# Patient Record
Sex: Female | Born: 1950
Health system: Southern US, Community
[De-identification: ages and names within clinical notes are randomized; demographics above are authoritative.]

## PROBLEM LIST (undated history)

## (undated) DIAGNOSIS — J302 Other seasonal allergic rhinitis: Secondary | ICD-10-CM

## (undated) DIAGNOSIS — Z9889 Other specified postprocedural states: Secondary | ICD-10-CM

## (undated) DIAGNOSIS — R519 Headache, unspecified: Secondary | ICD-10-CM

## (undated) DIAGNOSIS — I8393 Asymptomatic varicose veins of bilateral lower extremities: Secondary | ICD-10-CM

## (undated) DIAGNOSIS — M1990 Unspecified osteoarthritis, unspecified site: Secondary | ICD-10-CM

## (undated) DIAGNOSIS — F419 Anxiety disorder, unspecified: Secondary | ICD-10-CM

## (undated) DIAGNOSIS — E119 Type 2 diabetes mellitus without complications: Secondary | ICD-10-CM

## (undated) HISTORY — DX: Unspecified osteoarthritis, unspecified site: M19.90

## (undated) HISTORY — PX: NECK SURGERY: SHX720

## (undated) HISTORY — PX: BACK SURGERY: SHX140

## (undated) HISTORY — PX: BRAIN SURGERY: SHX531

## (undated) HISTORY — PX: COLON SURGERY: SHX602

## (undated) HISTORY — PX: HYSTERECTOMY ABDOMINAL WITH SALPINGO-OOPHORECTOMY: SHX6792

## (undated) HISTORY — PX: CHOLECYSTECTOMY: SHX55

## (undated) HISTORY — PX: KNEE ARTHROPLASTY: SHX992

## (undated) HISTORY — DX: Anxiety disorder, unspecified: F41.9

## (undated) HISTORY — DX: Type 2 diabetes mellitus without complications: E11.9

## (undated) HISTORY — DX: Other seasonal allergic rhinitis: J30.2

## (undated) HISTORY — PX: WISDOM TOOTH EXTRACTION: SHX21

---

## 2005-09-16 ENCOUNTER — Ambulatory Visit (HOSPITAL_COMMUNITY): Admission: RE | Admit: 2005-09-16 | Discharge: 2005-09-17 | Payer: Self-pay | Admitting: Neurosurgery

## 2016-01-12 DIAGNOSIS — J0100 Acute maxillary sinusitis, unspecified: Secondary | ICD-10-CM | POA: Diagnosis not present

## 2016-01-12 DIAGNOSIS — E1165 Type 2 diabetes mellitus with hyperglycemia: Secondary | ICD-10-CM | POA: Diagnosis not present

## 2016-01-12 DIAGNOSIS — H81399 Other peripheral vertigo, unspecified ear: Secondary | ICD-10-CM | POA: Diagnosis not present

## 2016-05-26 DIAGNOSIS — Z1389 Encounter for screening for other disorder: Secondary | ICD-10-CM | POA: Diagnosis not present

## 2016-05-26 DIAGNOSIS — E1122 Type 2 diabetes mellitus with diabetic chronic kidney disease: Secondary | ICD-10-CM | POA: Diagnosis not present

## 2016-05-26 DIAGNOSIS — J302 Other seasonal allergic rhinitis: Secondary | ICD-10-CM | POA: Diagnosis not present

## 2016-05-26 DIAGNOSIS — E1165 Type 2 diabetes mellitus with hyperglycemia: Secondary | ICD-10-CM | POA: Diagnosis not present

## 2016-05-26 DIAGNOSIS — G2581 Restless legs syndrome: Secondary | ICD-10-CM | POA: Diagnosis not present

## 2016-05-26 DIAGNOSIS — Z23 Encounter for immunization: Secondary | ICD-10-CM | POA: Diagnosis not present

## 2016-05-26 DIAGNOSIS — E782 Mixed hyperlipidemia: Secondary | ICD-10-CM | POA: Diagnosis not present

## 2016-05-26 DIAGNOSIS — F329 Major depressive disorder, single episode, unspecified: Secondary | ICD-10-CM | POA: Diagnosis not present

## 2016-06-10 DIAGNOSIS — Z1231 Encounter for screening mammogram for malignant neoplasm of breast: Secondary | ICD-10-CM | POA: Diagnosis not present

## 2016-09-03 DIAGNOSIS — Z23 Encounter for immunization: Secondary | ICD-10-CM | POA: Diagnosis not present

## 2016-12-07 DIAGNOSIS — G2581 Restless legs syndrome: Secondary | ICD-10-CM | POA: Diagnosis not present

## 2016-12-07 DIAGNOSIS — E1122 Type 2 diabetes mellitus with diabetic chronic kidney disease: Secondary | ICD-10-CM | POA: Diagnosis not present

## 2016-12-07 DIAGNOSIS — B001 Herpesviral vesicular dermatitis: Secondary | ICD-10-CM | POA: Diagnosis not present

## 2016-12-07 DIAGNOSIS — F329 Major depressive disorder, single episode, unspecified: Secondary | ICD-10-CM | POA: Diagnosis not present

## 2016-12-07 DIAGNOSIS — E1165 Type 2 diabetes mellitus with hyperglycemia: Secondary | ICD-10-CM | POA: Diagnosis not present

## 2016-12-07 DIAGNOSIS — E782 Mixed hyperlipidemia: Secondary | ICD-10-CM | POA: Diagnosis not present

## 2016-12-07 DIAGNOSIS — R42 Dizziness and giddiness: Secondary | ICD-10-CM | POA: Diagnosis not present

## 2017-01-29 DIAGNOSIS — J310 Chronic rhinitis: Secondary | ICD-10-CM | POA: Diagnosis not present

## 2017-06-03 DIAGNOSIS — E119 Type 2 diabetes mellitus without complications: Secondary | ICD-10-CM | POA: Diagnosis not present

## 2017-06-03 DIAGNOSIS — H2513 Age-related nuclear cataract, bilateral: Secondary | ICD-10-CM | POA: Diagnosis not present

## 2017-06-12 DIAGNOSIS — J209 Acute bronchitis, unspecified: Secondary | ICD-10-CM | POA: Diagnosis not present

## 2017-06-12 DIAGNOSIS — J069 Acute upper respiratory infection, unspecified: Secondary | ICD-10-CM | POA: Diagnosis not present

## 2017-06-13 DIAGNOSIS — J40 Bronchitis, not specified as acute or chronic: Secondary | ICD-10-CM | POA: Diagnosis not present

## 2017-06-13 DIAGNOSIS — M2550 Pain in unspecified joint: Secondary | ICD-10-CM | POA: Diagnosis not present

## 2017-06-14 DIAGNOSIS — Z1231 Encounter for screening mammogram for malignant neoplasm of breast: Secondary | ICD-10-CM | POA: Diagnosis not present

## 2017-06-22 DIAGNOSIS — R928 Other abnormal and inconclusive findings on diagnostic imaging of breast: Secondary | ICD-10-CM | POA: Diagnosis not present

## 2017-06-22 DIAGNOSIS — N6489 Other specified disorders of breast: Secondary | ICD-10-CM | POA: Diagnosis not present

## 2017-09-01 DIAGNOSIS — Z23 Encounter for immunization: Secondary | ICD-10-CM | POA: Diagnosis not present

## 2017-11-28 DIAGNOSIS — Z0000 Encounter for general adult medical examination without abnormal findings: Secondary | ICD-10-CM | POA: Diagnosis not present

## 2017-11-28 DIAGNOSIS — G2581 Restless legs syndrome: Secondary | ICD-10-CM | POA: Insufficient documentation

## 2017-11-28 DIAGNOSIS — I10 Essential (primary) hypertension: Secondary | ICD-10-CM | POA: Diagnosis not present

## 2017-11-28 DIAGNOSIS — E1165 Type 2 diabetes mellitus with hyperglycemia: Secondary | ICD-10-CM | POA: Insufficient documentation

## 2017-11-28 DIAGNOSIS — E782 Mixed hyperlipidemia: Secondary | ICD-10-CM | POA: Diagnosis not present

## 2017-11-28 DIAGNOSIS — J40 Bronchitis, not specified as acute or chronic: Secondary | ICD-10-CM | POA: Diagnosis not present

## 2018-06-08 DIAGNOSIS — G8929 Other chronic pain: Secondary | ICD-10-CM | POA: Diagnosis not present

## 2018-06-08 DIAGNOSIS — M545 Low back pain: Secondary | ICD-10-CM | POA: Diagnosis not present

## 2018-06-08 DIAGNOSIS — E1165 Type 2 diabetes mellitus with hyperglycemia: Secondary | ICD-10-CM | POA: Diagnosis not present

## 2018-06-08 DIAGNOSIS — M2550 Pain in unspecified joint: Secondary | ICD-10-CM | POA: Diagnosis not present

## 2018-06-08 DIAGNOSIS — E782 Mixed hyperlipidemia: Secondary | ICD-10-CM | POA: Diagnosis not present

## 2018-06-08 DIAGNOSIS — I10 Essential (primary) hypertension: Secondary | ICD-10-CM | POA: Diagnosis not present

## 2018-06-26 DIAGNOSIS — E119 Type 2 diabetes mellitus without complications: Secondary | ICD-10-CM | POA: Diagnosis not present

## 2018-06-26 DIAGNOSIS — H2513 Age-related nuclear cataract, bilateral: Secondary | ICD-10-CM | POA: Diagnosis not present

## 2018-06-27 DIAGNOSIS — Z1231 Encounter for screening mammogram for malignant neoplasm of breast: Secondary | ICD-10-CM | POA: Diagnosis not present

## 2018-07-29 DIAGNOSIS — J0100 Acute maxillary sinusitis, unspecified: Secondary | ICD-10-CM | POA: Diagnosis not present

## 2018-08-05 DIAGNOSIS — Z23 Encounter for immunization: Secondary | ICD-10-CM | POA: Diagnosis not present

## 2018-09-09 DIAGNOSIS — J019 Acute sinusitis, unspecified: Secondary | ICD-10-CM | POA: Diagnosis not present

## 2018-09-19 DIAGNOSIS — M545 Low back pain: Secondary | ICD-10-CM | POA: Diagnosis not present

## 2018-09-19 DIAGNOSIS — G8929 Other chronic pain: Secondary | ICD-10-CM | POA: Diagnosis not present

## 2018-09-19 DIAGNOSIS — R509 Fever, unspecified: Secondary | ICD-10-CM | POA: Diagnosis not present

## 2018-09-19 DIAGNOSIS — M2550 Pain in unspecified joint: Secondary | ICD-10-CM | POA: Diagnosis not present

## 2018-09-19 DIAGNOSIS — J40 Bronchitis, not specified as acute or chronic: Secondary | ICD-10-CM | POA: Diagnosis not present

## 2018-11-15 DIAGNOSIS — U071 COVID-19: Secondary | ICD-10-CM

## 2018-11-15 HISTORY — DX: COVID-19: U07.1

## 2019-01-01 DIAGNOSIS — B001 Herpesviral vesicular dermatitis: Secondary | ICD-10-CM | POA: Diagnosis not present

## 2019-01-01 DIAGNOSIS — E1165 Type 2 diabetes mellitus with hyperglycemia: Secondary | ICD-10-CM | POA: Diagnosis not present

## 2019-01-01 DIAGNOSIS — M2550 Pain in unspecified joint: Secondary | ICD-10-CM | POA: Diagnosis not present

## 2019-01-01 DIAGNOSIS — F329 Major depressive disorder, single episode, unspecified: Secondary | ICD-10-CM | POA: Diagnosis not present

## 2019-01-01 DIAGNOSIS — I10 Essential (primary) hypertension: Secondary | ICD-10-CM | POA: Diagnosis not present

## 2019-01-01 DIAGNOSIS — R42 Dizziness and giddiness: Secondary | ICD-10-CM | POA: Diagnosis not present

## 2019-01-01 DIAGNOSIS — F3342 Major depressive disorder, recurrent, in full remission: Secondary | ICD-10-CM | POA: Diagnosis not present

## 2019-01-01 DIAGNOSIS — Z0000 Encounter for general adult medical examination without abnormal findings: Secondary | ICD-10-CM | POA: Diagnosis not present

## 2019-01-01 DIAGNOSIS — Z1211 Encounter for screening for malignant neoplasm of colon: Secondary | ICD-10-CM | POA: Diagnosis not present

## 2019-01-01 DIAGNOSIS — E782 Mixed hyperlipidemia: Secondary | ICD-10-CM | POA: Diagnosis not present

## 2019-01-01 DIAGNOSIS — G2581 Restless legs syndrome: Secondary | ICD-10-CM | POA: Diagnosis not present

## 2019-01-05 DIAGNOSIS — Z1211 Encounter for screening for malignant neoplasm of colon: Secondary | ICD-10-CM | POA: Diagnosis not present

## 2019-02-14 DIAGNOSIS — D225 Melanocytic nevi of trunk: Secondary | ICD-10-CM | POA: Diagnosis not present

## 2019-02-14 DIAGNOSIS — L821 Other seborrheic keratosis: Secondary | ICD-10-CM | POA: Diagnosis not present

## 2019-02-14 DIAGNOSIS — D1801 Hemangioma of skin and subcutaneous tissue: Secondary | ICD-10-CM | POA: Diagnosis not present

## 2019-02-14 DIAGNOSIS — E7800 Pure hypercholesterolemia, unspecified: Secondary | ICD-10-CM | POA: Diagnosis not present

## 2019-02-14 DIAGNOSIS — L820 Inflamed seborrheic keratosis: Secondary | ICD-10-CM | POA: Diagnosis not present

## 2019-02-14 DIAGNOSIS — D485 Neoplasm of uncertain behavior of skin: Secondary | ICD-10-CM | POA: Diagnosis not present

## 2019-02-14 DIAGNOSIS — M48062 Spinal stenosis, lumbar region with neurogenic claudication: Secondary | ICD-10-CM | POA: Diagnosis not present

## 2019-06-12 DIAGNOSIS — L820 Inflamed seborrheic keratosis: Secondary | ICD-10-CM | POA: Diagnosis not present

## 2019-07-20 DIAGNOSIS — E119 Type 2 diabetes mellitus without complications: Secondary | ICD-10-CM | POA: Diagnosis not present

## 2019-07-20 DIAGNOSIS — H2513 Age-related nuclear cataract, bilateral: Secondary | ICD-10-CM | POA: Diagnosis not present

## 2019-10-04 DIAGNOSIS — Z1231 Encounter for screening mammogram for malignant neoplasm of breast: Secondary | ICD-10-CM | POA: Diagnosis not present

## 2020-01-23 DIAGNOSIS — E782 Mixed hyperlipidemia: Secondary | ICD-10-CM | POA: Diagnosis not present

## 2020-01-23 DIAGNOSIS — G2581 Restless legs syndrome: Secondary | ICD-10-CM | POA: Diagnosis not present

## 2020-01-23 DIAGNOSIS — B001 Herpesviral vesicular dermatitis: Secondary | ICD-10-CM | POA: Diagnosis not present

## 2020-01-23 DIAGNOSIS — E1165 Type 2 diabetes mellitus with hyperglycemia: Secondary | ICD-10-CM | POA: Diagnosis not present

## 2020-01-23 DIAGNOSIS — R42 Dizziness and giddiness: Secondary | ICD-10-CM | POA: Diagnosis not present

## 2020-01-23 DIAGNOSIS — I10 Essential (primary) hypertension: Secondary | ICD-10-CM | POA: Diagnosis not present

## 2020-01-23 DIAGNOSIS — F3342 Major depressive disorder, recurrent, in full remission: Secondary | ICD-10-CM | POA: Diagnosis not present

## 2020-01-23 DIAGNOSIS — Z0000 Encounter for general adult medical examination without abnormal findings: Secondary | ICD-10-CM | POA: Diagnosis not present

## 2020-01-23 DIAGNOSIS — M545 Low back pain: Secondary | ICD-10-CM | POA: Diagnosis not present

## 2020-01-23 DIAGNOSIS — G8929 Other chronic pain: Secondary | ICD-10-CM | POA: Diagnosis not present

## 2020-01-23 DIAGNOSIS — M2550 Pain in unspecified joint: Secondary | ICD-10-CM | POA: Diagnosis not present

## 2020-01-23 DIAGNOSIS — Z1211 Encounter for screening for malignant neoplasm of colon: Secondary | ICD-10-CM | POA: Diagnosis not present

## 2020-04-25 DIAGNOSIS — E1165 Type 2 diabetes mellitus with hyperglycemia: Secondary | ICD-10-CM | POA: Diagnosis not present

## 2020-04-25 DIAGNOSIS — R5381 Other malaise: Secondary | ICD-10-CM | POA: Diagnosis not present

## 2020-04-25 DIAGNOSIS — R5383 Other fatigue: Secondary | ICD-10-CM | POA: Diagnosis not present

## 2020-04-25 DIAGNOSIS — E782 Mixed hyperlipidemia: Secondary | ICD-10-CM | POA: Diagnosis not present

## 2020-04-25 DIAGNOSIS — R413 Other amnesia: Secondary | ICD-10-CM | POA: Insufficient documentation

## 2020-05-28 DIAGNOSIS — M79604 Pain in right leg: Secondary | ICD-10-CM | POA: Diagnosis not present

## 2020-05-28 DIAGNOSIS — M79661 Pain in right lower leg: Secondary | ICD-10-CM | POA: Diagnosis not present

## 2020-05-28 DIAGNOSIS — W19XXXA Unspecified fall, initial encounter: Secondary | ICD-10-CM | POA: Diagnosis not present

## 2020-05-28 DIAGNOSIS — M25561 Pain in right knee: Secondary | ICD-10-CM | POA: Diagnosis not present

## 2020-05-28 DIAGNOSIS — S8991XA Unspecified injury of right lower leg, initial encounter: Secondary | ICD-10-CM | POA: Diagnosis not present

## 2020-08-27 DIAGNOSIS — E119 Type 2 diabetes mellitus without complications: Secondary | ICD-10-CM | POA: Diagnosis not present

## 2020-08-27 DIAGNOSIS — H2513 Age-related nuclear cataract, bilateral: Secondary | ICD-10-CM | POA: Diagnosis not present

## 2021-01-13 DIAGNOSIS — N8111 Cystocele, midline: Secondary | ICD-10-CM | POA: Diagnosis not present

## 2021-02-26 DIAGNOSIS — R5383 Other fatigue: Secondary | ICD-10-CM | POA: Diagnosis not present

## 2021-02-26 DIAGNOSIS — I10 Essential (primary) hypertension: Secondary | ICD-10-CM | POA: Diagnosis not present

## 2021-02-26 DIAGNOSIS — E1165 Type 2 diabetes mellitus with hyperglycemia: Secondary | ICD-10-CM | POA: Diagnosis not present

## 2021-02-26 DIAGNOSIS — E782 Mixed hyperlipidemia: Secondary | ICD-10-CM | POA: Diagnosis not present

## 2021-02-26 DIAGNOSIS — R5381 Other malaise: Secondary | ICD-10-CM | POA: Diagnosis not present

## 2021-03-16 DIAGNOSIS — Z8669 Personal history of other diseases of the nervous system and sense organs: Secondary | ICD-10-CM | POA: Diagnosis not present

## 2021-03-16 DIAGNOSIS — E782 Mixed hyperlipidemia: Secondary | ICD-10-CM | POA: Diagnosis not present

## 2021-03-16 DIAGNOSIS — I10 Essential (primary) hypertension: Secondary | ICD-10-CM | POA: Diagnosis not present

## 2021-03-16 DIAGNOSIS — E1165 Type 2 diabetes mellitus with hyperglycemia: Secondary | ICD-10-CM | POA: Diagnosis not present

## 2021-03-16 DIAGNOSIS — F3342 Major depressive disorder, recurrent, in full remission: Secondary | ICD-10-CM | POA: Diagnosis not present

## 2021-03-16 DIAGNOSIS — Z0000 Encounter for general adult medical examination without abnormal findings: Secondary | ICD-10-CM | POA: Diagnosis not present

## 2021-03-16 DIAGNOSIS — R5383 Other fatigue: Secondary | ICD-10-CM | POA: Diagnosis not present

## 2021-04-06 DIAGNOSIS — Z1231 Encounter for screening mammogram for malignant neoplasm of breast: Secondary | ICD-10-CM | POA: Diagnosis not present

## 2021-04-07 ENCOUNTER — Encounter: Payer: Self-pay | Admitting: Gastroenterology

## 2021-04-28 DIAGNOSIS — N993 Prolapse of vaginal vault after hysterectomy: Secondary | ICD-10-CM | POA: Diagnosis not present

## 2021-05-05 DIAGNOSIS — F3342 Major depressive disorder, recurrent, in full remission: Secondary | ICD-10-CM | POA: Diagnosis not present

## 2021-05-05 DIAGNOSIS — E1165 Type 2 diabetes mellitus with hyperglycemia: Secondary | ICD-10-CM | POA: Diagnosis not present

## 2021-05-05 DIAGNOSIS — I10 Essential (primary) hypertension: Secondary | ICD-10-CM | POA: Diagnosis not present

## 2021-05-05 DIAGNOSIS — E782 Mixed hyperlipidemia: Secondary | ICD-10-CM | POA: Diagnosis not present

## 2021-05-25 DIAGNOSIS — N993 Prolapse of vaginal vault after hysterectomy: Secondary | ICD-10-CM | POA: Diagnosis not present

## 2021-05-25 DIAGNOSIS — N393 Stress incontinence (female) (male): Secondary | ICD-10-CM | POA: Diagnosis not present

## 2021-05-27 ENCOUNTER — Other Ambulatory Visit: Payer: Self-pay

## 2021-05-27 ENCOUNTER — Ambulatory Visit (AMBULATORY_SURGERY_CENTER): Payer: Self-pay

## 2021-05-27 VITALS — Ht 64.0 in | Wt 198.0 lb

## 2021-05-27 DIAGNOSIS — Z1211 Encounter for screening for malignant neoplasm of colon: Secondary | ICD-10-CM

## 2021-05-27 MED ORDER — GOLYTELY 236 G PO SOLR: 4000.0000 mL | ORAL | 0 refills | Status: DC

## 2021-05-27 NOTE — Progress Notes (Signed)
No egg or soy allergy known to patient  No issues with past sedation with any surgeries or procedures Patient denies ever being told they had issues or difficulty with intubation  No FH of Malignant Hyperthermia No diet pills per patient No home 02 use per patient  No blood thinners per patient  Pt denies issues with constipation  No A fib or A flutter   COVID 19 guidelines implemented in PV today with Pt and RN    NO PA's for preps discussed with pt in PV today  Discussed with pt there will be an out-of-pocket cost for prep and that varies from $0 to 70 dollars   Due to the COVID-19 pandemic we are asking patients to follow certain guidelines.  Pt aware of COVID protocols and [facility] guidelines

## 2021-06-01 ENCOUNTER — Encounter: Payer: Self-pay | Admitting: Gastroenterology

## 2021-06-01 ENCOUNTER — Ambulatory Visit (AMBULATORY_SURGERY_CENTER): Payer: PPO | Admitting: Gastroenterology

## 2021-06-01 ENCOUNTER — Other Ambulatory Visit: Payer: Self-pay

## 2021-06-01 VITALS — BP 128/68 | HR 63 | Temp 97.5°F | Resp 19 | Ht 64.0 in | Wt 198.0 lb

## 2021-06-01 DIAGNOSIS — K626 Ulcer of anus and rectum: Secondary | ICD-10-CM | POA: Diagnosis not present

## 2021-06-01 DIAGNOSIS — Z1211 Encounter for screening for malignant neoplasm of colon: Secondary | ICD-10-CM | POA: Diagnosis not present

## 2021-06-01 MED ORDER — SODIUM CHLORIDE 0.9 % IV SOLN: 500.0000 mL | Freq: Once | INTRAVENOUS | Status: AC

## 2021-06-01 NOTE — Progress Notes (Signed)
Pt's states no medical or surgical changes since previsit or office visit. 

## 2021-06-01 NOTE — Progress Notes (Signed)
Called to room to assist during endoscopic procedure.  Patient ID and intended procedure confirmed with present staff. Received instructions for my participation in the procedure from the performing physician.  

## 2021-06-01 NOTE — Op Note (Signed)
[place] Patient Name: Regina Cooper Procedure Date: 06/01/2021 2:20 PM MRN: [id number] Endoscopist: Jackquline Denmark , MD Age: 70 Referring MD:  Date of Birth: May 11, 1951 Gender: Female Account #: [id number] Procedure:                Colonoscopy Indications:              Screening for colorectal malignant neoplasm Medicines:                Monitored Anesthesia Care Procedure:                Pre-Anesthesia Assessment:                           - Prior to the procedure, a History and Physical                            was performed, and patient medications and                            allergies were reviewed. The patient's tolerance of                            previous anesthesia was also reviewed. The risks                            and benefits of the procedure and the sedation                            options and risks were discussed with the patient.                            All questions were answered, and informed consent                            was obtained. Prior Anticoagulants: The patient has                            taken no previous anticoagulant or antiplatelet                            agents. ASA Grade Assessment: II - A patient with                            mild systemic disease. After reviewing the risks                            and benefits, the patient was deemed in                            satisfactory condition to undergo the procedure.                           After obtaining informed consent, the colonoscope  was passed under direct vision. Throughout the                            procedure, the patient's blood pressure, pulse, and                            oxygen saturations were monitored continuously. The                            PCF-HQ190L Colonoscope was introduced through the                            anus and advanced to the the cecum, identified by                            appendiceal orifice  and ileocecal valve. The                            colonoscopy was performed without difficulty. The                            patient tolerated the procedure well. The quality                            of the bowel preparation was good. The ileocecal                            valve, appendiceal orifice, and rectum were                            photographed. Scope In: 2:34:38 PM Scope Out: 2:52:00 PM Scope Withdrawal Time: 0 hours 12 minutes 32 seconds  Total Procedure Duration: 0 hours 17 minutes 22 seconds  Findings:                 Multiple medium-mouthed diverticula were found in                            the sigmoid colon and descending colon.                           There was evidence of a prior end-to-end                            colo-colonic anastomosis in the mid transverse                            colon. This was patent.                           There was evidence of a prior end-to-end colo-anal                            anastomosis in the distal rectum with minor degree  of rectal prolapse. This was patent and was                            characterized by superficial ulceration (visualized                            on the rectal exam). Biopsies were taken with a                            cold forceps for histology.                           The exam was otherwise without abnormality on                            direct and retroflexion views. Complications:            No immediate complications. Estimated Blood Loss:     Estimated blood loss: none. Impression:               - Diverticulosis in the sigmoid colon and in the                            descending colon.                           - Patent end-to-end colo-colonic anastomosis.                           - Patent end-to-end colo-anal anastomosis with                            minor rectal prolapse, characterized by ulceration.                            Biopsied.                            - The examination was otherwise normal on direct                            and retroflexion views. Recommendation:           - Patient has a contact number available for                            emergencies. The signs and symptoms of potential                            delayed complications were discussed with the                            patient. Return to normal activities tomorrow.                            Written discharge instructions were provided to the  patient.                           - Resume previous diet.                           - Continue present medications.                           - Await pathology results.                           - Repeat colonoscopy is not recommended for                            screening purposes.                           - Preparation H ointment: Apply externally BID x 10                            days PRN.                           - The findings and recommendations were discussed                            with the patient's family. Jackquline Denmark, MD 06/01/2021 2:57:28 PM This report has been signed electronically.

## 2021-06-01 NOTE — Patient Instructions (Signed)
Discharge instructions given. Biopsies taken. See recommendations on report. Resume previous medications. YOU HAD AN ENDOSCOPIC PROCEDURE TODAY AT [facility]:   Refer to the procedure report that was given to you for any specific questions about what was found during the examination.  If the procedure report does not answer your questions, please call your gastroenterologist to clarify.  If you requested that your care partner not be given the details of your procedure findings, then the procedure report has been included in a sealed envelope for you to review at your convenience later.  YOU SHOULD EXPECT: Some feelings of bloating in the abdomen. Passage of more gas than usual.  Walking can help get rid of the air that was put into your GI tract during the procedure and reduce the bloating. If you had a lower endoscopy (such as a colonoscopy or flexible sigmoidoscopy) you may notice spotting of blood in your stool or on the toilet paper. If you underwent a bowel prep for your procedure, you may not have a normal bowel movement for a few days.  Please Note:  You might notice some irritation and congestion in your nose or some drainage.  This is from the oxygen used during your procedure.  There is no need for concern and it should clear up in a day or so.  SYMPTOMS TO REPORT IMMEDIATELY:  Following lower endoscopy (colonoscopy or flexible sigmoidoscopy):  Excessive amounts of blood in the stool  Significant tenderness or worsening of abdominal pains  Swelling of the abdomen that is new, acute  Fever of 100F or higher   For urgent or emergent issues, a gastroenterologist can be reached at any hour by calling [phone]. Do not use MyChart messaging for urgent concerns.    DIET:  We do recommend a small meal at first, but then you may proceed to your regular diet.  Drink plenty of fluids but you should avoid alcoholic beverages for 24 hours.  ACTIVITY:  You should plan  to take it easy for the rest of today and you should NOT DRIVE or use heavy machinery until tomorrow (because of the sedation medicines used during the test).    FOLLOW UP: Our staff will call the number listed on your records 48-72 hours following your procedure to check on you and address any questions or concerns that you may have regarding the information given to you following your procedure. If we do not reach you, we will leave a message.  We will attempt to reach you two times.  During this call, we will ask if you have developed any symptoms of COVID 19. If you develop any symptoms (ie: fever, flu-like symptoms, shortness of breath, cough etc.) before then, please call [phone].  If you test positive for Covid 19 in the 2 weeks post procedure, please call and report this information to [place].    If any biopsies were taken you will be contacted by phone or by letter within the next 1-3 weeks.  Please call us at [phone] if you have not heard about the biopsies in 3 weeks.    SIGNATURES/CONFIDENTIALITY: You and/or your care partner have signed paperwork which will be entered into your electronic medical record.  These signatures attest to the fact that that the information above on your After Visit Summary has been reviewed and is understood.  Full responsibility of the confidentiality of this discharge information lies with you and/or your care-partner.

## 2021-06-01 NOTE — Progress Notes (Signed)
PT taken to PACU. Monitors in place. VSS. Report given to RN. 

## 2021-06-02 DIAGNOSIS — N993 Prolapse of vaginal vault after hysterectomy: Secondary | ICD-10-CM | POA: Diagnosis not present

## 2021-06-03 ENCOUNTER — Telehealth: Payer: Self-pay | Admitting: *Deleted

## 2021-06-03 NOTE — Telephone Encounter (Signed)
No answer for post procedure call back. Left VM. 

## 2021-06-04 ENCOUNTER — Encounter: Payer: Self-pay | Admitting: Gastroenterology

## 2021-06-06 ENCOUNTER — Encounter: Payer: Self-pay | Admitting: Gastroenterology

## 2021-06-09 DIAGNOSIS — E1165 Type 2 diabetes mellitus with hyperglycemia: Secondary | ICD-10-CM | POA: Diagnosis not present

## 2021-06-15 DIAGNOSIS — E1142 Type 2 diabetes mellitus with diabetic polyneuropathy: Secondary | ICD-10-CM | POA: Diagnosis not present

## 2021-06-15 DIAGNOSIS — I10 Essential (primary) hypertension: Secondary | ICD-10-CM | POA: Diagnosis not present

## 2021-06-15 DIAGNOSIS — N993 Prolapse of vaginal vault after hysterectomy: Secondary | ICD-10-CM | POA: Diagnosis not present

## 2021-06-15 DIAGNOSIS — N393 Stress incontinence (female) (male): Secondary | ICD-10-CM | POA: Diagnosis not present

## 2021-06-15 DIAGNOSIS — N814 Uterovaginal prolapse, unspecified: Secondary | ICD-10-CM | POA: Diagnosis not present

## 2021-06-15 DIAGNOSIS — E669 Obesity, unspecified: Secondary | ICD-10-CM | POA: Diagnosis not present

## 2021-06-15 DIAGNOSIS — N3946 Mixed incontinence: Secondary | ICD-10-CM | POA: Diagnosis not present

## 2021-06-15 DIAGNOSIS — Z7984 Long term (current) use of oral hypoglycemic drugs: Secondary | ICD-10-CM | POA: Diagnosis not present

## 2021-06-15 DIAGNOSIS — N858 Other specified noninflammatory disorders of uterus: Secondary | ICD-10-CM | POA: Diagnosis not present

## 2021-06-15 DIAGNOSIS — E785 Hyperlipidemia, unspecified: Secondary | ICD-10-CM | POA: Diagnosis not present

## 2021-06-15 DIAGNOSIS — Z79899 Other long term (current) drug therapy: Secondary | ICD-10-CM | POA: Diagnosis not present

## 2021-06-15 DIAGNOSIS — E119 Type 2 diabetes mellitus without complications: Secondary | ICD-10-CM | POA: Diagnosis not present

## 2021-06-15 DIAGNOSIS — Z6833 Body mass index (BMI) 33.0-33.9, adult: Secondary | ICD-10-CM | POA: Diagnosis not present

## 2021-06-23 DIAGNOSIS — N8111 Cystocele, midline: Secondary | ICD-10-CM | POA: Diagnosis not present

## 2021-07-28 DIAGNOSIS — N8111 Cystocele, midline: Secondary | ICD-10-CM | POA: Diagnosis not present

## 2021-09-11 DIAGNOSIS — Z23 Encounter for immunization: Secondary | ICD-10-CM | POA: Diagnosis not present

## 2021-09-16 DIAGNOSIS — E119 Type 2 diabetes mellitus without complications: Secondary | ICD-10-CM | POA: Diagnosis not present

## 2021-09-16 DIAGNOSIS — H2513 Age-related nuclear cataract, bilateral: Secondary | ICD-10-CM | POA: Diagnosis not present

## 2021-09-20 DIAGNOSIS — R051 Acute cough: Secondary | ICD-10-CM | POA: Diagnosis not present

## 2021-09-20 DIAGNOSIS — Z20828 Contact with and (suspected) exposure to other viral communicable diseases: Secondary | ICD-10-CM | POA: Diagnosis not present

## 2021-09-25 DIAGNOSIS — J0140 Acute pansinusitis, unspecified: Secondary | ICD-10-CM | POA: Diagnosis not present

## 2021-09-25 DIAGNOSIS — E1165 Type 2 diabetes mellitus with hyperglycemia: Secondary | ICD-10-CM | POA: Diagnosis not present

## 2021-09-25 DIAGNOSIS — Z7984 Long term (current) use of oral hypoglycemic drugs: Secondary | ICD-10-CM | POA: Diagnosis not present

## 2021-10-24 DIAGNOSIS — U071 COVID-19: Secondary | ICD-10-CM | POA: Diagnosis not present

## 2021-10-24 DIAGNOSIS — R6889 Other general symptoms and signs: Secondary | ICD-10-CM | POA: Diagnosis not present

## 2021-10-24 DIAGNOSIS — R0981 Nasal congestion: Secondary | ICD-10-CM | POA: Diagnosis not present

## 2022-09-24 ENCOUNTER — Ambulatory Visit (INDEPENDENT_AMBULATORY_CARE_PROVIDER_SITE_OTHER): Payer: PPO

## 2022-09-24 ENCOUNTER — Encounter: Payer: Self-pay | Admitting: Physician Assistant

## 2022-09-24 ENCOUNTER — Ambulatory Visit: Payer: PPO | Admitting: Physician Assistant

## 2022-09-24 VITALS — BP 130/79 | HR 61

## 2022-09-24 DIAGNOSIS — M542 Cervicalgia: Secondary | ICD-10-CM

## 2022-09-24 DIAGNOSIS — M5412 Radiculopathy, cervical region: Secondary | ICD-10-CM | POA: Diagnosis not present

## 2022-09-24 DIAGNOSIS — M25511 Pain in right shoulder: Secondary | ICD-10-CM
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
## 2022-09-24 NOTE — Progress Notes (Signed)
Xr cer

## 2022-09-26 ENCOUNTER — Ambulatory Visit
Admission: RE | Admit: 2022-09-26 | Discharge: 2022-09-26 | Disposition: A | Discharge status: Home / Self Care | Payer: PPO | Source: Ambulatory Visit | Attending: Physician Assistant | Admitting: Physician Assistant

## 2022-09-26 DIAGNOSIS — M5412 Radiculopathy, cervical region: Secondary | ICD-10-CM

## 2022-09-27 ENCOUNTER — Telehealth: Payer: Self-pay | Admitting: Radiology

## 2022-09-27 DIAGNOSIS — M549 Dorsalgia, unspecified: Secondary | ICD-10-CM

## 2022-09-27 DIAGNOSIS — M5412 Radiculopathy, cervical region: Secondary | ICD-10-CM

## 2022-09-27 NOTE — Telephone Encounter (Signed)
I spoke with patient and advised. MRI thoracic spine scheduled for Thursday. Appt scheduled for Friday at 1:15 with Dr. Lorin Mercy to review scans.

## 2022-09-27 NOTE — Telephone Encounter (Signed)
I left voicemail for patient requesting return call. MRI cervical spine report received and MRI thoracic spine with and without contrast is recommended. Per Dr. Lorin Mercy, order that scan and then have patient return to see him to review both scans. MRI order entered.

## 2022-09-30 ENCOUNTER — Ambulatory Visit
Admission: RE | Admit: 2022-09-30 | Discharge: 2022-09-30 | Disposition: A | Discharge status: Home / Self Care | Payer: PPO | Source: Ambulatory Visit | Attending: Orthopaedic Surgery | Admitting: Orthopaedic Surgery

## 2022-09-30 DIAGNOSIS — M5412 Radiculopathy, cervical region: Secondary | ICD-10-CM

## 2022-09-30 DIAGNOSIS — M549 Dorsalgia, unspecified: Secondary | ICD-10-CM

## 2022-09-30 MED ORDER — GADOPICLENOL 0.5 MMOL/ML IV SOLN
9.0000 mL | Freq: Once | INTRAVENOUS | Status: AC | PRN
  Administered 2022-09-30: 9 mL via INTRAVENOUS

## 2022-10-01 ENCOUNTER — Ambulatory Visit: Payer: PPO | Admitting: Orthopaedic Surgery

## 2022-10-05 ENCOUNTER — Encounter: Payer: Self-pay | Admitting: Orthopaedic Surgery

## 2022-10-05 ENCOUNTER — Other Ambulatory Visit: Payer: Self-pay

## 2022-10-05 ENCOUNTER — Ambulatory Visit: Payer: PPO | Admitting: Orthopaedic Surgery

## 2022-10-05 ENCOUNTER — Encounter (HOSPITAL_COMMUNITY): Payer: Self-pay | Admitting: Orthopaedic Surgery

## 2022-10-05 VITALS — BP 120/71 | HR 69 | Ht 64.0 in | Wt 198.0 lb

## 2022-10-05 DIAGNOSIS — Z981 Arthrodesis status: Secondary | ICD-10-CM

## 2022-10-05 DIAGNOSIS — M4802 Spinal stenosis, cervical region: Secondary | ICD-10-CM | POA: Insufficient documentation

## 2022-10-05 DIAGNOSIS — M5000 Cervical disc disorder with myelopathy, unspecified cervical region: Secondary | ICD-10-CM | POA: Diagnosis not present
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
## 2022-10-05 NOTE — Progress Notes (Signed)
Spoke with pt for pre-op call. Pt denies HTN or cardiac history. Pt is treated for Type 2 Diabetes. Pt's last A1C was 7.3 on 09/24/22. She states her fasting blood sugar is usually around 125. Instructed pt not to take her Glipizide tonight or in the AM. She will not take Januvia and Actos in the AM. Instructed pt to check her blood sugar when she wakes up in the AM and every 2 hours until she leaves for the hospital. If blood sugar is 70 or below, treat with 1/2 cup of clear juice (apple or cranberry) and recheck blood sugar 15 minutes after drinking juice. If blood sugar continues to be 70 or below, call the Short Stay department and ask to speak to a nurse.   Shower instructions given to pt and she voiced understanding.

## 2022-10-06 ENCOUNTER — Encounter (HOSPITAL_COMMUNITY): Payer: Self-pay | Admitting: Orthopaedic Surgery

## 2022-10-06 ENCOUNTER — Encounter (HOSPITAL_COMMUNITY)
Admission: RE | Disposition: A | Discharge status: Home / Self Care | Payer: Self-pay | Source: Home / Self Care | Attending: Orthopaedic Surgery

## 2022-10-06 ENCOUNTER — Ambulatory Visit (HOSPITAL_BASED_OUTPATIENT_CLINIC_OR_DEPARTMENT_OTHER): Payer: PPO | Admitting: Anesthesiology

## 2022-10-06 ENCOUNTER — Observation Stay (HOSPITAL_COMMUNITY)
Admission: RE | Admit: 2022-10-06 | Discharge: 2022-10-07 | Disposition: A | Discharge status: Home / Self Care | Payer: PPO | Attending: Orthopaedic Surgery | Admitting: Orthopaedic Surgery

## 2022-10-06 ENCOUNTER — Ambulatory Visit (HOSPITAL_COMMUNITY): Payer: PPO | Admitting: Anesthesiology

## 2022-10-06 ENCOUNTER — Other Ambulatory Visit: Payer: Self-pay

## 2022-10-06 ENCOUNTER — Ambulatory Visit (HOSPITAL_COMMUNITY): Payer: PPO

## 2022-10-06 DIAGNOSIS — Z01818 Encounter for other preprocedural examination: Secondary | ICD-10-CM

## 2022-10-06 DIAGNOSIS — M5021 Other cervical disc displacement,  high cervical region: Secondary | ICD-10-CM | POA: Diagnosis present

## 2022-10-06 DIAGNOSIS — Z8616 Personal history of COVID-19: Secondary | ICD-10-CM | POA: Diagnosis not present

## 2022-10-06 DIAGNOSIS — Z96651 Presence of right artificial knee joint: Secondary | ICD-10-CM | POA: Insufficient documentation

## 2022-10-06 DIAGNOSIS — M5001 Cervical disc disorder with myelopathy,  high cervical region: Secondary | ICD-10-CM | POA: Diagnosis not present

## 2022-10-06 DIAGNOSIS — E119 Type 2 diabetes mellitus without complications: Secondary | ICD-10-CM | POA: Diagnosis not present

## 2022-10-06 DIAGNOSIS — M4802 Spinal stenosis, cervical region: Secondary | ICD-10-CM | POA: Diagnosis present

## 2022-10-06 HISTORY — DX: Asymptomatic varicose veins of bilateral lower extremities: I83.93

## 2022-10-06 HISTORY — DX: Headache, unspecified: R51.9

## 2022-10-06 HISTORY — PX: ANTERIOR CERVICAL DECOMP/DISCECTOMY FUSION: SHX1161

## 2022-10-06 HISTORY — DX: Other specified postprocedural states: Z98.890

## 2022-10-06 LAB — HEMOGLOBIN A1C
Hgb A1c MFr Bld: 7.1 % — ABNORMAL HIGH (ref 4.8–5.6)
Mean Plasma Glucose: 157.07 mg/dL

## 2022-10-06 LAB — GLUCOSE, CAPILLARY
Glucose-Capillary: 194 mg/dL — ABNORMAL HIGH (ref 70–99)
Glucose-Capillary: 120 mg/dL — ABNORMAL HIGH (ref 70–99)
Glucose-Capillary: 142 mg/dL — ABNORMAL HIGH (ref 70–99)
Glucose-Capillary: 184 mg/dL — ABNORMAL HIGH (ref 70–99)
Glucose-Capillary: 207 mg/dL — ABNORMAL HIGH (ref 70–99)

## 2022-10-06 LAB — SURGICAL PCR SCREEN
MRSA, PCR: NEGATIVE
Staphylococcus aureus: POSITIVE — AB

## 2022-10-06 SURGERY — ANTERIOR CERVICAL DECOMPRESSION/DISCECTOMY FUSION 1 LEVEL
Anesthesia: General | Site: Spine Cervical

## 2022-10-06 MED ORDER — DEXAMETHASONE SODIUM PHOSPHATE 10 MG/ML IJ SOLN
INTRAMUSCULAR | Status: AC
  Filled 2022-10-06: qty 1

## 2022-10-06 MED ORDER — FENTANYL CITRATE (PF) 250 MCG/5ML IJ SOLN
INTRAMUSCULAR | Status: DC | PRN
  Administered 2022-10-06: 50 ug via INTRAVENOUS
  Administered 2022-10-06: 100 ug via INTRAVENOUS

## 2022-10-06 MED ORDER — OXYCODONE-ACETAMINOPHEN 5-325 MG PO TABS: 1.0000 | ORAL_TABLET | ORAL | 0 refills | Status: DC | PRN

## 2022-10-06 MED ORDER — CEFAZOLIN SODIUM-DEXTROSE 2-4 GM/100ML-% IV SOLN
INTRAVENOUS | Status: AC
  Filled 2022-10-06: qty 100

## 2022-10-06 MED ORDER — PHENYLEPHRINE HCL-NACL 20-0.9 MG/250ML-% IV SOLN
INTRAVENOUS | Status: DC | PRN
  Administered 2022-10-06: 20 ug/min via INTRAVENOUS

## 2022-10-06 MED ORDER — 0.9 % SODIUM CHLORIDE (POUR BTL) OPTIME
TOPICAL | Status: DC | PRN
  Administered 2022-10-06: 1000 mL

## 2022-10-06 MED ORDER — CITALOPRAM HYDROBROMIDE 20 MG PO TABS
20.0000 mg | ORAL_TABLET | Freq: Every day | ORAL | Status: DC
  Administered 2022-10-07: 20 mg via ORAL
  Filled 2022-10-06 (×2): qty 1

## 2022-10-06 MED ORDER — SIMVASTATIN 20 MG PO TABS
40.0000 mg | ORAL_TABLET | Freq: Every day | ORAL | Status: DC
  Administered 2022-10-06: 40 mg via ORAL
  Filled 2022-10-06: qty 2

## 2022-10-06 MED ORDER — LISINOPRIL 2.5 MG PO TABS
2.5000 mg | ORAL_TABLET | Freq: Every day | ORAL | Status: DC
  Administered 2022-10-06 – 2022-10-07 (×2): 2.5 mg via ORAL
  Filled 2022-10-06 (×3): qty 1

## 2022-10-06 MED ORDER — CHLORHEXIDINE GLUCONATE 0.12 % MT SOLN
OROMUCOSAL | Status: AC
  Administered 2022-10-06: 15 mL via OROMUCOSAL
  Filled 2022-10-06: qty 15

## 2022-10-06 MED ORDER — LIDOCAINE 2% (20 MG/ML) 5 ML SYRINGE
INTRAMUSCULAR | Status: AC
  Filled 2022-10-06: qty 5

## 2022-10-06 MED ORDER — CHLORHEXIDINE GLUCONATE CLOTH 2 % EX PADS: 6.0000 | MEDICATED_PAD | Freq: Every day | CUTANEOUS | Status: DC

## 2022-10-06 MED ORDER — DEXAMETHASONE SODIUM PHOSPHATE 10 MG/ML IJ SOLN
INTRAMUSCULAR | Status: DC | PRN
  Administered 2022-10-06: 5 mg via INTRAVENOUS

## 2022-10-06 MED ORDER — SURGIFLO WITH THROMBIN (HEMOSTATIC MATRIX KIT) OPTIME
TOPICAL | Status: DC | PRN
  Administered 2022-10-06: 1 via TOPICAL

## 2022-10-06 MED ORDER — ROCURONIUM BROMIDE 10 MG/ML (PF) SYRINGE
PREFILLED_SYRINGE | INTRAVENOUS | Status: AC
  Filled 2022-10-06: qty 10

## 2022-10-06 MED ORDER — ALBUTEROL SULFATE (2.5 MG/3ML) 0.083% IN NEBU: 2.5000 mg | INHALATION_SOLUTION | Freq: Every day | RESPIRATORY_TRACT | Status: DC | PRN

## 2022-10-06 MED ORDER — LINAGLIPTIN 5 MG PO TABS
5.0000 mg | ORAL_TABLET | Freq: Every day | ORAL | Status: DC
  Administered 2022-10-06 – 2022-10-07 (×2): 5 mg via ORAL
  Filled 2022-10-06 (×2): qty 1

## 2022-10-06 MED ORDER — OXYCODONE HCL 5 MG PO TABS: 5.0000 mg | ORAL_TABLET | Freq: Once | ORAL | Status: AC | PRN

## 2022-10-06 MED ORDER — ACETAMINOPHEN 325 MG PO TABS: 650.0000 mg | ORAL_TABLET | ORAL | Status: DC | PRN

## 2022-10-06 MED ORDER — ROCURONIUM BROMIDE 10 MG/ML (PF) SYRINGE
PREFILLED_SYRINGE | INTRAVENOUS | Status: DC | PRN
  Administered 2022-10-06: 70 mg via INTRAVENOUS

## 2022-10-06 MED ORDER — ONDANSETRON HCL 4 MG/2ML IJ SOLN
INTRAMUSCULAR | Status: AC
  Filled 2022-10-06: qty 2

## 2022-10-06 MED ORDER — BUPIVACAINE-EPINEPHRINE 0.5% -1:200000 IJ SOLN
INTRAMUSCULAR | Status: DC | PRN
  Administered 2022-10-06: 3 mL

## 2022-10-06 MED ORDER — LACTATED RINGERS IV SOLN: INTRAVENOUS | Status: DC

## 2022-10-06 MED ORDER — PROPOFOL 10 MG/ML IV BOLUS
INTRAVENOUS | Status: AC
  Filled 2022-10-06: qty 20

## 2022-10-06 MED ORDER — SODIUM CHLORIDE 0.9 % IV SOLN: INTRAVENOUS | Status: DC

## 2022-10-06 MED ORDER — METHOCARBAMOL 500 MG PO TABS: 500.0000 mg | ORAL_TABLET | Freq: Four times a day (QID) | ORAL | 0 refills | Status: DC

## 2022-10-06 MED ORDER — ONDANSETRON HCL 4 MG/2ML IJ SOLN: 4.0000 mg | Freq: Once | INTRAMUSCULAR | Status: DC | PRN

## 2022-10-06 MED ORDER — INSULIN ASPART 100 UNIT/ML IJ SOLN
0.0000 [IU] | INTRAMUSCULAR | Status: DC | PRN
  Administered 2022-10-06: 2 [IU] via SUBCUTANEOUS

## 2022-10-06 MED ORDER — BUPIVACAINE-EPINEPHRINE (PF) 0.5% -1:200000 IJ SOLN
INTRAMUSCULAR | Status: AC
  Filled 2022-10-06: qty 30

## 2022-10-06 MED ORDER — GLIPIZIDE 10 MG PO TABS
10.0000 mg | ORAL_TABLET | Freq: Two times a day (BID) | ORAL | Status: DC
  Administered 2022-10-07: 10 mg via ORAL
  Filled 2022-10-06 (×3): qty 1

## 2022-10-06 MED ORDER — ONDANSETRON HCL 4 MG/2ML IJ SOLN: 4.0000 mg | Freq: Four times a day (QID) | INTRAMUSCULAR | Status: DC | PRN

## 2022-10-06 MED ORDER — METHOCARBAMOL 500 MG PO TABS: 500.0000 mg | ORAL_TABLET | Freq: Four times a day (QID) | ORAL | Status: DC | PRN

## 2022-10-06 MED ORDER — MUPIROCIN 2 % EX OINT
1.0000 | TOPICAL_OINTMENT | Freq: Two times a day (BID) | CUTANEOUS | Status: DC
  Administered 2022-10-06: 1 via NASAL
  Filled 2022-10-06 (×2): qty 22

## 2022-10-06 MED ORDER — ACETAMINOPHEN 650 MG RE SUPP: 650.0000 mg | RECTAL | Status: DC | PRN

## 2022-10-06 MED ORDER — CEFAZOLIN SODIUM-DEXTROSE 2-4 GM/100ML-% IV SOLN
2.0000 g | INTRAVENOUS | Status: AC
  Administered 2022-10-06: 2 g via INTRAVENOUS

## 2022-10-06 MED ORDER — POVIDONE-IODINE 10 % EX SWAB
2.0000 | Freq: Once | CUTANEOUS | Status: AC
  Administered 2022-10-06: 2 via TOPICAL

## 2022-10-06 MED ORDER — CHLORHEXIDINE GLUCONATE 4 % EX LIQD: 60.0000 mL | Freq: Once | CUTANEOUS | Status: DC

## 2022-10-06 MED ORDER — SODIUM CHLORIDE 0.9% FLUSH: 3.0000 mL | INTRAVENOUS | Status: DC | PRN

## 2022-10-06 MED ORDER — SODIUM CHLORIDE 0.9 % IV SOLN: 250.0000 mL | INTRAVENOUS | Status: DC

## 2022-10-06 MED ORDER — ORAL CARE MOUTH RINSE: 15.0000 mL | Freq: Once | OROMUCOSAL | Status: AC

## 2022-10-06 MED ORDER — METHOCARBAMOL 500 MG PO TABS: 500.0000 mg | ORAL_TABLET | Freq: Four times a day (QID) | ORAL | 0 refills | Status: DC | PRN

## 2022-10-06 MED ORDER — FENTANYL CITRATE (PF) 100 MCG/2ML IJ SOLN
INTRAMUSCULAR | Status: AC
  Administered 2022-10-06: 50 ug via INTRAVENOUS
  Filled 2022-10-06: qty 2

## 2022-10-06 MED ORDER — GABAPENTIN 100 MG PO CAPS
100.0000 mg | ORAL_CAPSULE | Freq: Three times a day (TID) | ORAL | Status: DC
  Administered 2022-10-06 – 2022-10-07 (×3): 100 mg via ORAL
  Filled 2022-10-06 (×3): qty 1

## 2022-10-06 MED ORDER — DOCUSATE SODIUM 100 MG PO CAPS
100.0000 mg | ORAL_CAPSULE | Freq: Two times a day (BID) | ORAL | Status: DC
  Administered 2022-10-06: 100 mg via ORAL
  Filled 2022-10-06 (×2): qty 1

## 2022-10-06 MED ORDER — FENTANYL CITRATE (PF) 250 MCG/5ML IJ SOLN
INTRAMUSCULAR | Status: AC
  Filled 2022-10-06: qty 5

## 2022-10-06 MED ORDER — INSULIN ASPART 100 UNIT/ML IJ SOLN
INTRAMUSCULAR | Status: AC
  Filled 2022-10-06: qty 1

## 2022-10-06 MED ORDER — CHLORHEXIDINE GLUCONATE 0.12 % MT SOLN: 15.0000 mL | Freq: Once | OROMUCOSAL | Status: AC

## 2022-10-06 MED ORDER — ONDANSETRON HCL 4 MG PO TABS: 4.0000 mg | ORAL_TABLET | Freq: Four times a day (QID) | ORAL | Status: DC | PRN

## 2022-10-06 MED ORDER — SODIUM CHLORIDE 0.9% FLUSH
3.0000 mL | Freq: Two times a day (BID) | INTRAVENOUS | Status: DC
  Administered 2022-10-06 – 2022-10-07 (×2): 3 mL via INTRAVENOUS

## 2022-10-06 MED ORDER — INSULIN ASPART 100 UNIT/ML IJ SOLN
0.0000 [IU] | Freq: Three times a day (TID) | INTRAMUSCULAR | Status: DC
  Administered 2022-10-06 – 2022-10-07 (×2): 3 [IU] via SUBCUTANEOUS

## 2022-10-06 MED ORDER — ONDANSETRON HCL 4 MG/2ML IJ SOLN
INTRAMUSCULAR | Status: DC | PRN
  Administered 2022-10-06: 4 mg via INTRAVENOUS

## 2022-10-06 MED ORDER — MECLIZINE HCL 25 MG PO TABS: 25.0000 mg | ORAL_TABLET | Freq: Three times a day (TID) | ORAL | Status: DC | PRN

## 2022-10-06 MED ORDER — OXYCODONE HCL 5 MG/5ML PO SOLN
5.0000 mg | Freq: Once | ORAL | Status: AC | PRN
  Administered 2022-10-06: 5 mg via ORAL

## 2022-10-06 MED ORDER — METHOCARBAMOL 1000 MG/10ML IJ SOLN: 500.0000 mg | Freq: Four times a day (QID) | INTRAVENOUS | Status: DC | PRN

## 2022-10-06 MED ORDER — OXYCODONE HCL 5 MG PO TABS
5.0000 mg | ORAL_TABLET | ORAL | Status: DC | PRN
  Administered 2022-10-06 – 2022-10-07 (×3): 5 mg via ORAL
  Filled 2022-10-06 (×3): qty 1

## 2022-10-06 MED ORDER — PIOGLITAZONE HCL 30 MG PO TABS
30.0000 mg | ORAL_TABLET | Freq: Every day | ORAL | Status: DC
  Administered 2022-10-07: 30 mg via ORAL
  Filled 2022-10-06: qty 1

## 2022-10-06 MED ORDER — LIDOCAINE 2% (20 MG/ML) 5 ML SYRINGE
INTRAMUSCULAR | Status: DC | PRN
  Administered 2022-10-06: 60 mg via INTRAVENOUS

## 2022-10-06 MED ORDER — OXYCODONE HCL 5 MG/5ML PO SOLN
ORAL | Status: AC
  Filled 2022-10-06: qty 5

## 2022-10-06 MED ORDER — POLYETHYLENE GLYCOL 3350 17 G PO PACK
17.0000 g | PACK | Freq: Every day | ORAL | Status: DC
  Administered 2022-10-06: 17 g via ORAL
  Filled 2022-10-06 (×2): qty 1

## 2022-10-06 MED ORDER — PHENOL 1.4 % MT LIQD: 1.0000 | OROMUCOSAL | Status: DC | PRN

## 2022-10-06 MED ORDER — HYDROMORPHONE HCL 1 MG/ML IJ SOLN: 0.2500 mg | INTRAMUSCULAR | Status: DC | PRN

## 2022-10-06 MED ORDER — MENTHOL 3 MG MT LOZG: 1.0000 | LOZENGE | OROMUCOSAL | Status: DC | PRN

## 2022-10-06 MED ORDER — SUGAMMADEX SODIUM 200 MG/2ML IV SOLN
INTRAVENOUS | Status: DC | PRN
  Administered 2022-10-06: 200 mg via INTRAVENOUS

## 2022-10-06 MED ORDER — PROPOFOL 10 MG/ML IV BOLUS
INTRAVENOUS | Status: DC | PRN
  Administered 2022-10-06: 120 mg via INTRAVENOUS

## 2022-10-06 MED ORDER — HYDROMORPHONE HCL 1 MG/ML IJ SOLN: 0.5000 mg | INTRAMUSCULAR | Status: DC | PRN

## 2022-10-06 MED ORDER — FENTANYL CITRATE (PF) 100 MCG/2ML IJ SOLN: 50.0000 ug | Freq: Once | INTRAMUSCULAR | Status: AC

## 2022-10-06 SURGICAL SUPPLY — 48 items
BAG COUNTER SPONGE SURGICOUNT (BAG) ×1 IMPLANT
BENZOIN TINCTURE PRP APPL 2/3 (GAUZE/BANDAGES/DRESSINGS) ×1 IMPLANT
BIT DRILL W/STOP SM 12 (BIT) IMPLANT
BLADE CLIPPER SURG (BLADE) IMPLANT
BONE CC-ACS 11X14 X8 6D (Bone Implant) ×1 IMPLANT
BUR ROUND FLUTED 4 SOFT TCH (BURR) ×1 IMPLANT
CHIPS BONE CANC-ACS 11X14X8 6D (Bone Implant) IMPLANT
COLLAR CERV LO CONTOUR FIRM DE (SOFTGOODS) ×1 IMPLANT
COVER MAYO STAND STRL (DRAPES) ×1 IMPLANT
COVER SURGICAL LIGHT HANDLE (MISCELLANEOUS) ×1 IMPLANT
DRAPE C-ARM 42X72 X-RAY (DRAPES) ×1 IMPLANT
DRAPE HALF SHEET 40X57 (DRAPES) ×1 IMPLANT
DRAPE MICROSCOPE SLANT 54X150 (MISCELLANEOUS) ×1 IMPLANT
DURAPREP 6ML APPLICATOR 50/CS (WOUND CARE) ×1 IMPLANT
ELECT COATED BLADE 2.86 ST (ELECTRODE) ×1 IMPLANT
ELECT REM PT RETURN 9FT ADLT (ELECTROSURGICAL) ×1
ELECTRODE REM PT RTRN 9FT ADLT (ELECTROSURGICAL) ×1 IMPLANT
EVACUATOR 1/8 PVC DRAIN (DRAIN) ×1 IMPLANT
GAUZE SPONGE 4X4 12PLY STRL (GAUZE/BANDAGES/DRESSINGS) ×1 IMPLANT
GLOVE BIOGEL PI IND STRL 8 (GLOVE) ×2 IMPLANT
GLOVE ORTHO TXT STRL SZ7.5 (GLOVE) ×2 IMPLANT
GOWN STRL REUS W/ TWL LRG LVL3 (GOWN DISPOSABLE) ×1 IMPLANT
GOWN STRL REUS W/ TWL XL LVL3 (GOWN DISPOSABLE) ×1 IMPLANT
GOWN STRL REUS W/TWL 2XL LVL3 (GOWN DISPOSABLE) ×1 IMPLANT
GOWN STRL REUS W/TWL LRG LVL3 (GOWN DISPOSABLE) ×1
GOWN STRL REUS W/TWL XL LVL3 (GOWN DISPOSABLE) ×1
HALTER HD/CHIN CERV TRACTION D (MISCELLANEOUS) ×1 IMPLANT
KIT BASIN OR (CUSTOM PROCEDURE TRAY) ×1 IMPLANT
KIT TURNOVER KIT B (KITS) ×1 IMPLANT
NDL 25GX 5/8IN NON SAFETY (NEEDLE) ×1 IMPLANT
NEEDLE 25GX 5/8IN NON SAFETY (NEEDLE) ×1 IMPLANT
NS IRRIG 1000ML POUR BTL (IV SOLUTION) ×1 IMPLANT
PACK ORTHO CERVICAL (CUSTOM PROCEDURE TRAY) ×1 IMPLANT
PAD ARMBOARD 7.5X6 YLW CONV (MISCELLANEOUS) ×2 IMPLANT
PATTIES SURGICAL .5 X.5 (GAUZE/BANDAGES/DRESSINGS) IMPLANT
PLATE ANT CERV XTEND 1 LV 16 (Plate) IMPLANT
POSITIONER HEAD DONUT 9IN (MISCELLANEOUS) ×1 IMPLANT
SCREW XTD VAR 4.2 SELF TAP 12 (Screw) IMPLANT
STRIP CLOSURE SKIN 1/2X4 (GAUZE/BANDAGES/DRESSINGS) ×1 IMPLANT
SURGIFLO W/THROMBIN 8M KIT (HEMOSTASIS) IMPLANT
SUT BONE WAX W31G (SUTURE) ×1 IMPLANT
SUT VIC AB 3-0 PS2 18 (SUTURE) ×1
SUT VIC AB 3-0 PS2 18XBRD (SUTURE) ×1 IMPLANT
SUT VIC AB 4-0 PS2 27 (SUTURE) ×1 IMPLANT
SYR BULB EAR ULCER 3OZ GRN STR (SYRINGE) ×1 IMPLANT
TOWEL GREEN STERILE (TOWEL DISPOSABLE) ×1 IMPLANT
TOWEL GREEN STERILE FF (TOWEL DISPOSABLE) ×1 IMPLANT
WATER STERILE IRR 1000ML POUR (IV SOLUTION) ×1 IMPLANT

## 2022-10-06 NOTE — Progress Notes (Signed)
Patient arrived to 5N09 in NAD, VS stable. Patient oriented to room and call bell in reach. Family at bedside.

## 2022-10-06 NOTE — Anesthesia Preprocedure Evaluation (Addendum)
Anesthesia Evaluation  Patient identified by MRN, date of birth, ID band Patient awake    Reviewed: Allergy & Precautions, NPO status , Patient's Chart, lab work & pertinent test results  History of Anesthesia Complications (+) PONV and history of anesthetic complications  Airway Mallampati: II  TM Distance: >3 FB Neck ROM: Limited    Dental  (+) Edentulous Upper, Edentulous Lower   Pulmonary    Pulmonary exam normal breath sounds clear to auscultation       Cardiovascular hypertension, Pt. on medications Normal cardiovascular exam Rhythm:Regular Rate:Normal     Neuro/Psych  Headaches PSYCHIATRIC DISORDERS Anxiety Depression     Neuromuscular disease    GI/Hepatic negative GI ROS, Neg liver ROS,,,  Endo/Other  diabetes, Well Controlled, Type 2, Oral Hypoglycemic Agents  Obesity Hyperlipidemia  Renal/GU negative Renal ROS  negative genitourinary   Musculoskeletal  (+) Arthritis , Osteoarthritis,  HNP C3-4 with pinal stenosis and myelopathy S/P cervical fusion   Abdominal  (+) + obese  Peds  Hematology negative hematology ROS (+)   Anesthesia Other Findings   Reproductive/Obstetrics                             Anesthesia Physical Anesthesia Plan  ASA: 2  Anesthesia Plan: General   Post-op Pain Management: Precedex and Ofirmev IV (intra-op)*   Induction: Intravenous  PONV Risk Score and Plan: 4 or greater and Treatment may vary due to age or medical condition, Ondansetron and Dexamethasone  Airway Management Planned: Oral ETT and Video Laryngoscope Planned  Additional Equipment: None  Intra-op Plan:   Post-operative Plan: Extubation in OR  Informed Consent: I have reviewed the patients History and Physical, chart, labs and discussed the procedure including the risks, benefits and alternatives for the proposed anesthesia with the patient or authorized representative who has  indicated his/her understanding and acceptance.     Dental advisory given  Plan Discussed with: CRNA and Anesthesiologist  Anesthesia Plan Comments:         Anesthesia Quick Evaluation

## 2022-10-06 NOTE — Op Note (Signed)
Pre and postop diagnosis: C3-4 HNP with cord compression and acute myelopathy.  Procedure: C3-4 anterior cervical discectomy and fusion allograft and plate.  Surgeon: Rodell Perna, MD  Assistant: Benjiman Core, PA-C medically necessary and present for the entire procedure  EBL less than 100 cc  Drains 1 Hemovac neck  Anesthesia glide scope intubation and 6 cc Marcaine skin local.  Implants:BONE CC-ACS 11X14 X8 6D - [id number]  Inventory Item: BONE CC-ACS 11X14 X8 6D Serial no.: [id number] Model/Cat no.: [id number]  Implant name: BONE CC-ACS 11X14 X8 6D - [id number] Laterality: N/A Area: Cervical Level 3-4  Manufacturer: Gerilyn Nestle FNDN Date of Manufacture:   Action: Implanted Number Used: 1   Device Identifier: Device Identifier Type:   PLATE ANT CERV XTEND 1 LV 16 - [id number]  Inventory Item: PLATE ANT CERV XTEND 1 LV 16 Serial no.: Model/Cat no.: [id number]  Implant name: PLATE ANT CERV XTEND 1 LV 16 - [id number] Laterality: N/A Area: Cervical Level 3-4  Manufacturer: [place] Date of Manufacture:   Action: Implanted Number Used: 1   Device Identifier: Device Identifier Type:   SCREW XTD VAR 4.2 SELF TAP 12 - [id number]  Inventory Item: SCREW XTD VAR 4.2 SELF TAP 12 Serial no.: Model/Cat no.: [id number]  Implant name: SCREW XTD VAR 4.2 SELF TAP 12 - [id number] Laterality: N/A Area: Cervical Level 3-4  Manufacturer: [place] Date of Manufacture:   Action: Implanted Number Used: 4   Device Identifier: Device Identifier Type:     Procedure after induction of the general anesthesia oral intubation with a glide scope patient had restraints applied which were not used arms were tucked side with yellow pads hello traction was applied only used for graft insertion.  Operative for DVT prophylaxis.  Neck was prepped with DuraPrep there is squared with towels sterile skin marker above her old C5-6 transverse incision that started at the midline extending to the  left.  Betadine Steri-Drape sterile Mayo stand at the head thyroid sheets and draped.  After timeout procedure incision was started to midline extending to the left.  PCR returned during the case that she was positive for MSSA but negative for MRSA and got Ancef.  Deep to the platysma was divided blunt dissection down to prominent spur short needle was placed at the upper level which was C2-3 using a 25 short needle.  Needle was moved down 1 to C3-4 which was the operative level and a second spot image was taken and the disc was immediately marked by taking a large chunk out of the disc using the scalpel and then micropituitary.  Self-retaining client retractors were placed teeth placed right and left smooth blade cephalad caudad.  Operative microscope was brought in and we proceeded with discectomy progressing back to posterior longitudinal ligament dura multiple large chunks of disc adherent to the cord that had to be teased off with blue and black micropituitary and micro nerve roots.  Each fragment was gently peeled off and then removed with the micro pituitary.  Decompression uncovertebral joints with large curettes.  Once decompression was performed spurs removed some additional bony removed off of the C4 endplate to give [place] enough time to make sure that all fragments were removed.  Posterior longitudinal ligament was completely removed.  Old dura was bulging through the interval.  Some Surgiflo was used epidural space was dry.  Trial sizing progressing from 6, 7 and then finally an 8 trial showed date gave a nice fit a graft was inserted after marking  it anteriorly with CRNA pulling traction countersinking 2 mm and then removing some spurs off the front so it was flat for plate selection.  A globus plate was selected and fixed with 412 mm screws intermittent checking with C arm for position.  Plate screws graft in good position.  Final spot pictures were taken and screws were locked in with a tiny black  screwdriver handle.  Hemovac was placed on the left side and out technique in line with skin incision platysma closed with 3-0 Vicryl 4-0 Vicryl subcuticular closure tincture benzoin Steri-Strips total 6 cc Marcaine infiltration.  Postop dressing tape and soft cervical collar.  Patient noted return of her right arm strength and resolution of right hand numbness.  Bilateral lower extremity tingling and weakness had resolved.  Patient tolerated procedure well instrument count needle count was correct.

## 2022-10-06 NOTE — Transfer of Care (Signed)
Immediate Anesthesia Transfer of Care Note  Patient: CHAMEKA MCMULLEN  Procedure(s) Performed: CERVICAL THREE-FOUR ANTERIOR CERVICAL DISCECTOMY FUSION (Spine Cervical)  Patient Location: PACU  Anesthesia Type:General  Level of Consciousness: awake, alert , and oriented  Airway & Oxygen Therapy: Patient Spontanous Breathing  Post-op Assessment: Report given to RN, Post -op Vital signs reviewed and stable, and Patient moving all extremities X 4  Post vital signs: Reviewed and stable  Last Vitals:  Vitals Value Taken Time  BP 125/64 10/06/22 1443  Temp    Pulse 85 10/06/22 1445  Resp 21 10/06/22 1445  SpO2 90 % 10/06/22 1445  Vitals shown include unvalidated device data.  Last Pain:  Vitals:   10/06/22 1114  PainSc: 9       Patients Stated Pain Goal: 2 (40/45/91 3685)  Complications: No notable events documented.
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
## 2022-10-06 NOTE — Anesthesia Postprocedure Evaluation (Signed)
Anesthesia Post Note  Patient: Regina Cooper  Procedure(s) Performed: CERVICAL THREE-FOUR ANTERIOR CERVICAL DISCECTOMY FUSION (Spine Cervical)     Patient location during evaluation: PACU Anesthesia Type: General Level of consciousness: awake and alert and oriented Pain management: pain level controlled Vital Signs Assessment: post-procedure vital signs reviewed and stable Respiratory status: spontaneous breathing, nonlabored ventilation and respiratory function stable Cardiovascular status: blood pressure returned to baseline and stable Postop Assessment: no apparent nausea or vomiting Anesthetic complications: no   No notable events documented.  Last Vitals:  Vitals:   10/06/22 1445 10/06/22 1500  BP: 135/66 127/64  Pulse: 84 79  Resp: 15 15  Temp: 36.9 C   SpO2: 95% 93%    Last Pain:  Vitals:   10/06/22 1500  PainSc: 0-No pain                 Marisa Hufstetler A.

## 2022-10-06 NOTE — Interval H&P Note (Signed)
History and Physical Interval Note:  10/06/2022 12:23 PM  Regina Cooper  has presented today for surgery, with the diagnosis of C3-4 stenosis, cord compression, myelopathy.  The various methods of treatment have been discussed with the patient and family. After consideration of risks, benefits and other options for treatment, the patient has consented to  Procedure(s): C3-4 ANTERIOR CERVICAL DISCECTOMY FUSION, ALLOGRAFT, PLATE (N/A) as a surgical intervention.  The patient's history has been reviewed, patient examined, no change in status, stable for surgery.  I have reviewed the patient's chart and labs.  Questions were answered to the patient's satisfaction.     Marybelle Killings

## 2022-10-06 NOTE — Anesthesia Procedure Notes (Signed)
Procedure Name: Intubation Date/Time: 10/06/2022 12:39 PM  Performed by: Kyung Rudd, CRNAPre-anesthesia Checklist: Patient identified, Emergency Drugs available, Suction available and Patient being monitored Patient Re-evaluated:Patient Re-evaluated prior to induction Oxygen Delivery Method: Circle system utilized Preoxygenation: Pre-oxygenation with 100% oxygen Induction Type: IV induction Ventilation: Mask ventilation without difficulty Laryngoscope Size: Glidescope and 3 Tube type: Oral Tube size: 7.0 mm Number of attempts: 1 Airway Equipment and Method: Rigid stylet and Video-laryngoscopy Placement Confirmation: ETT inserted through vocal cords under direct vision, positive ETCO2 and breath sounds checked- equal and bilateral Secured at: 20 cm Tube secured with: Tape Dental Injury: Teeth and Oropharynx as per pre-operative assessment  Comments: Grade 1 view on Glidescope screen. Head and neck remained in neutral cervical alignment.

## 2022-10-07 ENCOUNTER — Encounter (HOSPITAL_COMMUNITY): Payer: Self-pay | Admitting: Orthopaedic Surgery

## 2022-10-07 DIAGNOSIS — M5021 Other cervical disc displacement,  high cervical region: Secondary | ICD-10-CM | POA: Diagnosis not present

## 2022-10-07 LAB — GLUCOSE, CAPILLARY: Glucose-Capillary: 161 mg/dL — ABNORMAL HIGH (ref 70–99)

## 2022-10-07 NOTE — Progress Notes (Signed)
Patient ID: Regina Cooper, female   DOB: 1951-05-08, 71 y.o.   MRN: [id number]   Subjective: 1 Day Post-Op Procedure(s) (LRB): CERVICAL THREE-FOUR ANTERIOR CERVICAL DISCECTOMY FUSION (N/A) Patient reports pain as mild and moderate.  Still some tingling in right UE. Strength better. Ambulated to BR.   Objective: Vital signs in last 24 hours: Temp:  [97.7 F (36.5 C)-98.4 F (36.9 C)] 97.8 F (36.6 C) (11/23 0752) Pulse Rate:  [72-84] 84 (11/23 0752) Resp:  [10-19] 17 (11/23 0752) BP: (119-139)/(62-68) 135/62 (11/23 0752) SpO2:  [93 %-98 %] 98 % (11/23 0752) Weight:  [89.8 kg] 89.8 kg (11/22 1800)  Intake/Output from previous day: 11/22 0701 - 11/23 0700 In: 1240 [P.O.:240; I.V.:800; IV Piggyback:100] Out: 100 [Blood:100] Intake/Output this shift: No intake/output data recorded.  No results for input(s): "HGB" in the last 72 hours. No results for input(s): "WBC", "RBC", "HCT", "PLT" in the last 72 hours. No results for input(s): "NA", "K", "CL", "CO2", "BUN", "CREATININE", "GLUCOSE", "CALCIUM" in the last 72 hours. No results for input(s): "LABPT", "INR" in the last 72 hours.  Neurologically intact DG Cervical Spine 2 or 3 views  Result Date: 10/06/2022 CLINICAL DATA:  Elective surgery EXAM: CERVICAL SPINE - 2-3 VIEW COMPARISON:  September 24, 2022 cervical radiographs. FINDINGS: Fluoro time: 13 seconds Radiation: 0.81 mGy Three C-arm fluoroscopic images were obtained intraoperatively and submitted for post operative interpretation. These images demonstrate surgical changes associated with C3-C4 ACDF. Please see the performing provider's procedural report for further detail. IMPRESSION: Intraoperative fluoroscopy from C3-C4 ACDF. Electronically Signed   By: Margaretha Sheffield M.D.   On: 10/06/2022 14:52   DG C-Arm 1-60 Min-No Report  Result Date: 10/06/2022 Fluoroscopy was utilized by the requesting physician.  No radiographic interpretation.   DG C-Arm 1-60 Min-No  Report  Result Date: 10/06/2022 Fluoroscopy was utilized by the requesting physician.  No radiographic interpretation.    Assessment/Plan: 1 Day Post-Op Procedure(s) (LRB): CERVICAL THREE-FOUR ANTERIOR CERVICAL DISCECTOMY FUSION (N/A) Up with therapy, then discharge home. Walked with cane pre-op due to cord compression and balance problems . Best to continue cane for now. Office one week.   Marybelle Killings 10/07/2022, 8:22 AM

## 2022-10-07 NOTE — Discharge Instructions (Signed)
Keep your collar on at all times.  Stay with soft food for a week or so since you should have some difficulty swallowing.  Avoid chunks of meat.  You will have less swelling if you stay in a recliner beachchair position.  Your dressing is waterproof.  Use extra collar wrapped in Saran wrap when he take a shower and then after you get out of the shower and dry off change back to the dry collar without moving your neck and then you can get dressed and resume normal activity.  Avoid bending and lifting.  See Dr. Lorin Mercy in 1 week.  Use her cane to help with balance.

## 2022-10-07 NOTE — Care Management Obs Status (Signed)
[facility] NOTIFICATION   Patient Details  Name: Regina Cooper MRN: [id number] Date of Birth: Sep 08, 1951   Medicare Observation Status Notification Given:  Yes    Tom-Johnson, Renea Ee, RN 10/07/2022, 10:03 AM

## 2022-10-07 NOTE — TOC Transition Note (Signed)
Transition of Care [facility]) - CM/SW Discharge Note   Patient Details  Name: Regina Cooper MRN: [id number] Date of Birth: Jul 23, 1951  [facility] CM/SW Contact:  Tom-Johnson, Renea Ee, RN Phone Number: 10/07/2022, 9:59 AM   Clinical Narrative:     Patient is scheduled for discharge today. No TOC needs or recommendations noted. Denies any needs. Family to transport at discharge. No further TOC needs noted.      Final next level of care: Home/Self Care Barriers to Discharge: Barriers Resolved   Patient Goals and CMS Choice Patient states their goals for this hospitalization and ongoing recovery are:: To return home CMS Medicare.gov Compare Post Acute Care list provided to:: Patient Choice offered to / list presented to : NA  Discharge Placement                Patient to be transferred to facility by: Family      Discharge Plan and Services                DME Arranged: N/A DME Agency: NA       HH Arranged: NA HH Agency: NA        Social Determinants of Health (SDOH) Interventions Transportation Interventions: Inpatient TOC, Patient Resources (Friends/Family)   Readmission Risk Interventions     No data to display

## 2022-10-07 NOTE — Evaluation (Signed)
Physical Therapy Evaluation Patient Details Name: Regina Cooper MRN: [id number] DOB: 04/17/1951 Today's Date: 10/07/2022  History of Present Illness  Pt is a 71 y.o. female s/p ACDF C3-4 to address C3-4 stenosis, cord compression and myelopathy. PMH significant for R shoulder pain, memory change, recurrent major depressive disorder in full remission, back surgery, HTN, and T2DM.   Clinical Impression  Pt in bed upon arrival of PT, agreeable to evaluation at this time. Prior to admission the pt was independent with mobility, reports no recent falls or need for assist. Lives with spouse in home with 5 steps to enter. The pt now presents with minor limitations in functional mobility, endurance, and dynamic stability due to above dx, but is safe to return home with spouse when medically cleared. She was able to complete hallway ambulation without need for DME or assist and 5 steps with BUE support (to mimic home environment) without issue or need for assist. Discussed implications of cervical precautions on mobility and safety with movement at home and pt expressed verbal understanding. No further acute PT needs, no follow up therapy indicated at this time. Thank you for the consult.   Gait Speed: 0.9ms. (Gait speed < 1.047m indicates increased risk of falls)   Recommendations for follow up therapy are one component of a multi-disciplinary discharge planning process, led by the attending physician.  Recommendations may be updated based on patient status, additional functional criteria and insurance authorization.  Follow Up Recommendations No PT follow up      Assistance Recommended at Discharge PRN  Patient can return home with the following  Help with stairs or ramp for entrance;Assist for transportation    Equipment Recommendations None recommended by PT  Recommendations for Other Services       Functional Status Assessment Patient has had a recent decline in their functional status and  demonstrates the ability to make significant improvements in function in a reasonable and predictable amount of time.     Precautions / Restrictions Precautions Precautions: Cervical Precaution Booklet Issued: Yes (comment) Precaution Comments: all education reviewed within the context of ADL Required Braces or Orthoses: Cervical Brace Cervical Brace: Soft collar;At all times Restrictions Weight Bearing Restrictions: No      Mobility  Bed Mobility Overal bed mobility: Needs Assistance             General bed mobility comments: Pt OOB upon arrival, returned to recliner    Transfers Overall transfer level: Needs assistance Equipment used: None Transfers: Sit to/from Stand Sit to Stand: Supervision           General transfer comment: supervision both with and without use of DME    Ambulation/Gait Ambulation/Gait assistance: Supervision Gait Distance (Feet): 150 Feet Assistive device: None Gait Pattern/deviations: Step-through pattern Gait velocity: 0.71 m/s Gait velocity interpretation: 1.31 - 2.62 ft/sec, indicative of limited community ambulator   General Gait Details: slow but stable, discussed fall risk reduction. good tolerance of balance challenge and able to maintain cervical precautions with mobility  Stairs Stairs: Yes Stairs assistance: Supervision Stair Management: Alternating pattern, Forwards, Two rails Number of Stairs: 5 General stair comments: BUE support on rail, no overt LOB      Balance Overall balance assessment: Mild deficits observed, not formally tested  Pertinent Vitals/Pain Pain Assessment Pain Assessment: Faces Faces Pain Scale: Hurts a little bit Pain Location: operative site Pain Descriptors / Indicators: Operative site guarding Pain Intervention(s): Monitored during session    Home Living Family/patient expects to be discharged to:: Private residence Living  Arrangements: Spouse/significant other Available Help at Discharge: Family Type of Home: House Home Access: Stairs to enter Entrance Stairs-Rails: Right;Left;Can reach both Entrance Stairs-Number of Steps: 5   Home Layout: One level Home Equipment: Cane - single point;Shower seat      Prior Function Prior Level of Function : Needs assist             Mobility Comments: occasional use of cane, reports no falls ADLs Comments: Pt reporting husband often performs the cooking and cleaning within the home.     Hand Dominance        Extremity/Trunk Assessment   Upper Extremity Assessment Upper Extremity Assessment: Defer to OT evaluation RUE Deficits / Details: Tingling following median nerve distribution in tips of fingers    Lower Extremity Assessment Lower Extremity Assessment: Overall WFL for tasks assessed    Cervical / Trunk Assessment Cervical / Trunk Assessment: Neck Surgery  Communication   Communication: No difficulties  Cognition Arousal/Alertness: Awake/alert Behavior During Therapy: WFL for tasks assessed/performed Overall Cognitive Status: Within Functional Limits for tasks assessed                                 General Comments: has knoelwdge of all precautions        General Comments General comments (skin integrity, edema, etc.): VSS on RA        Assessment/Plan    PT Assessment Patient does not need any further PT services         PT Goals (Current goals can be found in the Care Plan section)  Acute Rehab PT Goals Patient Stated Goal: return home today PT Goal Formulation: All assessment and education complete, DC therapy Time For Goal Achievement: 10/14/22 Potential to Achieve Goals: Good    Frequency          AM-PAC PT "6 Clicks" Mobility  Outcome Measure Help needed turning from your back to your side while in a flat bed without using bedrails?: None Help needed moving from lying on your back to sitting on the  side of a flat bed without using bedrails?: None Help needed moving to and from a bed to a chair (including a wheelchair)?: None Help needed standing up from a chair using your arms (e.g., wheelchair or bedside chair)?: None Help needed to walk in hospital room?: A Little Help needed climbing 3-5 steps with a railing? : A Little 6 Click Score: 22    End of Session Equipment Utilized During Treatment: Gait belt;Cervical collar Activity Tolerance: Patient tolerated treatment well Patient left: in chair;with call bell/phone within reach;with family/visitor present Nurse Communication: Mobility status PT Visit Diagnosis: Other abnormalities of gait and mobility (R26.89)    Time: 1032-1040 PT Time Calculation (min) (ACUTE ONLY): 8 min   Charges:   PT Evaluation $PT Eval Low Complexity: 1 Low          West Carbo, PT, DPT   Acute Rehabilitation Department  Sandra Cockayne 10/07/2022, 11:09 AM

## 2022-10-07 NOTE — Evaluation (Signed)
Occupational Therapy Evaluation Patient Details Name: Regina Cooper MRN: [id number] DOB: [birth year] Today's Date: 10/07/2022   History of Present Illness Pt is a 71 y.o. female s/p ACDF C3-4 to address C3-4 stenosis, cord compression and myelopathy. PMH significant for R shoulder pain, memory change, recurrent major depressive disorder in full remission, back surgery, HTN, and T2DM.   Clinical Impression   PTA, pt lived with her husband who assisted with cooking and cleaning. Upon eval, pt with awareness of precautions, but required up to min cues for maintaining during ADL. Pt educated and demonstrating use of compensatory techniques for LB dressing, UB dressing, shower transfer, grooming, toileting, and bed mobility within precautions. Recommending discharge home with no OT follow up at this time.      Recommendations for follow up therapy are one component of a multi-disciplinary discharge planning process, led by the attending physician.  Recommendations may be updated based on patient status, additional functional criteria and insurance authorization.   Follow Up Recommendations  No OT follow up     Assistance Recommended at Discharge PRN  Patient can return home with the following A little help with walking and/or transfers;A little help with bathing/dressing/bathroom;Help with stairs or ramp for entrance;Assistance with cooking/housework    Functional Status Assessment  Patient has had a recent decline in their functional status and demonstrates the ability to make significant improvements in function in a reasonable and predictable amount of time.  Equipment Recommendations  None recommended by OT    Recommendations for Other Services       Precautions / Restrictions Precautions Precautions: Cervical Precaution Booklet Issued: Yes (comment) Precaution Comments: all education reviewed within the context of ADL Required Braces or Orthoses: Cervical Brace Cervical Brace: Soft  collar;At all times Restrictions Weight Bearing Restrictions: No      Mobility Bed Mobility Overal bed mobility: Needs Assistance Bed Mobility: Rolling, Sidelying to Sit Rolling: Supervision Sidelying to sit: Supervision       General bed mobility comments: cues for precautions    Transfers Overall transfer level: Needs assistance Equipment used: None Transfers: Sit to/from Stand Sit to Stand: Supervision           General transfer comment: supervision for safety      Balance Overall balance assessment: Mild deficits observed, not formally tested                                         ADL either performed or assessed with clinical judgement   ADL Overall ADL's : Needs assistance/impaired Eating/Feeding: Modified independent;Sitting   Grooming: Wash/dry hands;Supervision/safety;Standing   Upper Body Bathing: Set up;Sitting   Lower Body Bathing: Supervison/ safety;Sit to/from stand   Upper Body Dressing : Set up;Sitting;Supervision/safety Upper Body Dressing Details (indicate cue type and reason): cues for compensatory techniques Lower Body Dressing: Supervision/safety;Sit to/from stand Lower Body Dressing Details (indicate cue type and reason): cues for compensatory tecnhiques Toilet Transfer: Supervision/safety;Ambulation;Min guard   Toileting- Clothing Manipulation and Hygiene: Set up;Sitting/lateral lean Toileting - Clothing Manipulation Details (indicate cue type and reason): reviewed toileting within precautions Tub/ Shower Transfer: Min guard;Ambulation;Shower seat;Walk-in shower   Functional mobility during ADLs: Supervision/safety General ADL Comments: Supervision for safety due to intermittent poor adherance to cervical precautions     Vision Baseline Vision/History: 1 Wears glasses Ability to See in Adequate Light: 0 Adequate Patient Visual Report: No change from baseline Additional Comments: glasses  for reading      Perception Perception Perception Tested?: No   Praxis Praxis Praxis tested?: Not tested    Pertinent Vitals/Pain Pain Assessment Pain Assessment: Faces Faces Pain Scale: Hurts a little bit Pain Location: operative site Pain Descriptors / Indicators: Operative site guarding Pain Intervention(s): Limited activity within patient's tolerance, Monitored during session     Hand Dominance     Extremity/Trunk Assessment Upper Extremity Assessment Upper Extremity Assessment: Generalized weakness;RUE deficits/detail RUE Deficits / Details: Tingling following median nerve distribution in tips of fingers   Lower Extremity Assessment Lower Extremity Assessment: Defer to PT evaluation   Cervical / Trunk Assessment Cervical / Trunk Assessment: Neck Surgery   Communication Communication Communication: No difficulties   Cognition Arousal/Alertness: Awake/alert Behavior During Therapy: WFL for tasks assessed/performed Overall Cognitive Status: Within Functional Limits for tasks assessed                                 General Comments: has knoelwdge of all precautions     General Comments  VSS    Exercises     Shoulder Instructions      Home Living Family/patient expects to be discharged to:: Private residence Living Arrangements: Spouse/significant other Available Help at Discharge: Family Type of Home: House Home Access: Stairs to enter Technical brewer of Steps: 5   Home Layout: One level     Bathroom Shower/Tub: Occupational psychologist: Standard     Home Equipment: Cane - single point;Shower seat          Prior Functioning/Environment Prior Level of Function : Needs assist             Mobility Comments: occasional use of cane ADLs Comments: Pt reporting husband often performs the cooking and cleaning within the home.        OT Problem List: Decreased strength;Decreased activity tolerance;Impaired balance (sitting and/or  standing);Decreased knowledge of precautions;Decreased knowledge of use of DME or AE;Pain      OT Treatment/Interventions:      OT Goals(Current goals can be found in the care plan section) Acute Rehab OT Goals Patient Stated Goal: go home OT Goal Formulation: With patient  OT Frequency:      Co-evaluation              AM-PAC OT "6 Clicks" Daily Activity     Outcome Measure Help from another person eating meals?: None Help from another person taking care of personal grooming?: A Little Help from another person toileting, which includes using toliet, bedpan, or urinal?: A Little Help from another person bathing (including washing, rinsing, drying)?: A Little Help from another person to put on and taking off regular upper body clothing?: A Little Help from another person to put on and taking off regular lower body clothing?: A Little 6 Click Score: 19   End of Session Equipment Utilized During Treatment: Cervical collar Nurse Communication: Mobility status  Activity Tolerance: Patient tolerated treatment well Patient left: Other (comment) (in hall with PT)  OT Visit Diagnosis: Unsteadiness on feet (R26.81);Muscle weakness (generalized) (M62.81);Pain;Other abnormalities of gait and mobility (R26.89) Pain - part of body:  (operative site)                Time: 2992-4268 OT Time Calculation (min): 16 min Charges:  OT General Charges $OT Visit: 1 Visit OT Evaluation $OT Eval Low Complexity: 1 Low  Magnus Ivan, OTD, OTR/L [facility]  Acute Rehabilitation Office: [phone]    Magnus Ivan 10/07/2022, 10:43 AM

## 2022-10-07 NOTE — Plan of Care (Signed)
  Problem: Education: Goal: Knowledge of General Education information will improve Description: Including pain rating scale, medication(s)/side effects and non-pharmacologic comfort measures Outcome: Progressing   Problem: Health Behavior/Discharge Planning: Goal: Ability to manage health-related needs will improve Outcome: Progressing   Problem: Clinical Measurements: Goal: Ability to maintain clinical measurements within normal limits will improve Outcome: Progressing Goal: Will remain free from infection Outcome: Progressing Goal: Diagnostic test results will improve Outcome: Progressing Goal: Respiratory complications will improve Outcome: Progressing Goal: Cardiovascular complication will be avoided Outcome: Progressing   Problem: Activity: Goal: Risk for activity intolerance will decrease Outcome: Progressing   Problem: Nutrition: Goal: Adequate nutrition will be maintained Outcome: Progressing   Problem: Coping: Goal: Level of anxiety will decrease Outcome: Progressing   Problem: Elimination: Goal: Will not experience complications related to bowel motility Outcome: Progressing Goal: Will not experience complications related to urinary retention Outcome: Progressing   Problem: Pain Managment: Goal: General experience of comfort will improve Outcome: Progressing   Problem: Safety: Goal: Ability to remain free from injury will improve Outcome: Progressing   Problem: Skin Integrity: Goal: Risk for impaired skin integrity will decrease Outcome: Progressing   Problem: Education: Goal: Ability to verbalize activity precautions or restrictions will improve Outcome: Progressing Goal: Knowledge of the prescribed therapeutic regimen will improve Outcome: Progressing Goal: Understanding of discharge needs will improve Outcome: Progressing   Problem: Activity: Goal: Ability to avoid complications of mobility impairment will improve Outcome: Progressing Goal:  Ability to tolerate increased activity will improve Outcome: Progressing Goal: Will remain free from falls Outcome: Progressing   Problem: Bowel/Gastric: Goal: Gastrointestinal status for postoperative course will improve Outcome: Progressing   Problem: Clinical Measurements: Goal: Ability to maintain clinical measurements within normal limits will improve Outcome: Progressing Goal: Postoperative complications will be avoided or minimized Outcome: Progressing Goal: Diagnostic test results will improve Outcome: Progressing   Problem: Pain Management: Goal: Pain level will decrease Outcome: Progressing   Problem: Skin Integrity: Goal: Will show signs of wound healing Outcome: Progressing   Problem: Health Behavior/Discharge Planning: Goal: Identification of resources available to assist in meeting health care needs will improve Outcome: Progressing   Problem: Bladder/Genitourinary: Goal: Urinary functional status for postoperative course will improve Outcome: Progressing   Problem: Education: Goal: Ability to describe self-care measures that may prevent or decrease complications (Diabetes Survival Skills Education) will improve Outcome: Progressing Goal: Individualized Educational Video(s) Outcome: Progressing   Problem: Coping: Goal: Ability to adjust to condition or change in health will improve Outcome: Progressing   Problem: Fluid Volume: Goal: Ability to maintain a balanced intake and output will improve Outcome: Progressing   Problem: Health Behavior/Discharge Planning: Goal: Ability to identify and utilize available resources and services will improve Outcome: Progressing Goal: Ability to manage health-related needs will improve Outcome: Progressing   Problem: Metabolic: Goal: Ability to maintain appropriate glucose levels will improve Outcome: Progressing   Problem: Nutritional: Goal: Maintenance of adequate nutrition will improve Outcome:  Progressing Goal: Progress toward achieving an optimal weight will improve Outcome: Progressing   Problem: Skin Integrity: Goal: Risk for impaired skin integrity will decrease Outcome: Progressing   Problem: Tissue Perfusion: Goal: Adequacy of tissue perfusion will improve Outcome: Progressing

## 2022-10-07 NOTE — Progress Notes (Signed)
Orthopedic Tech Progress Note Patient Details:  Regina Cooper Jan 28, 1951 [id number]  An extra soft collar was provided to the pt for use when bathing/showering.   Ortho Devices Type of Ortho Device: Soft collar Ortho Device/Splint Location: handed to pt Ortho Device/Splint Interventions: Ordered   Post Interventions Instructions Provided: Care of device, Adjustment of device  Regina Cooper Jeri Modena 10/07/2022, 12:24 PM

## 2022-10-08 ENCOUNTER — Encounter (HOSPITAL_COMMUNITY): Payer: Self-pay | Admitting: Orthopaedic Surgery

## 2022-10-11 NOTE — Discharge Summary (Signed)
Patient ID: Regina Cooper MRN: [id number] DOB/AGE: 03-06-1951 71 y.o.  Admit date: 10/06/2022 Discharge date: 10/07/2022  Admission Diagnoses:  Principal Problem:   Cervical spinal stenosis Active Problems:   Cervical stenosis of spine   Discharge Diagnoses:  Principal Problem:   Cervical spinal stenosis Active Problems:   Cervical stenosis of spine  status post Procedure(s): CERVICAL THREE-FOUR ANTERIOR CERVICAL DISCECTOMY FUSION  Past Medical History:  Diagnosis Date   Anxiety    Arthritis    generalized   COVID 2020   moderate case   Diabetes mellitus without complication ([place])    on meds   Headache    PONV (postoperative nausea and vomiting)    Seasonal allergies    Varicose veins of both lower extremities     Surgeries: Procedure(s): CERVICAL THREE-FOUR ANTERIOR CERVICAL DISCECTOMY FUSION on 10/06/2022   Consultants:   Discharged Condition: Improved  Hospital Course: Regina Cooper is an 71 y.o. female who was admitted 10/06/2022 for operative treatment of Cervical spinal stenosis. Patient failed conservative treatments (please see the history and physical for the specifics) and had severe unremitting pain that affects sleep, daily activities and work/hobbies. After pre-op clearance, the patient was taken to the operating room on 10/06/2022 and underwent  Procedure(s): CERVICAL THREE-FOUR ANTERIOR CERVICAL DISCECTOMY FUSION.    Patient was given perioperative antibiotics:  Anti-infectives (From admission, onward)    Start     Dose/Rate Route Frequency Ordered Stop   10/06/22 1115  ceFAZolin (ANCEF) IVPB 2g/100 mL premix        2 g 200 mL/hr over 30 Minutes Intravenous On call to O.R. 10/06/22 0941 10/06/22 1300   10/06/22 0948  ceFAZolin (ANCEF) 2-4 GM/100ML-% IVPB       Note to Pharmacy: Gleason, Ginger E: cabinet override      10/06/22 0948 10/06/22 1302        Patient was given sequential compression devices and early ambulation to prevent DVT.    Patient benefited maximally from hospital stay and there were no complications. At the time of discharge, the patient was urinating/moving their bowels without difficulty, tolerating a regular diet, pain is controlled with oral pain medications and they have been cleared by PT/OT.   Recent vital signs: No data found.   Recent laboratory studies: No results for input(s): "WBC", "HGB", "HCT", "PLT", "NA", "K", "CL", "CO2", "BUN", "CREATININE", "GLUCOSE", "INR", "CALCIUM" in the last 72 hours.  Invalid input(s): "PT", "2"   Discharge Medications:   Allergies as of 10/07/2022       Reactions   Aspirin Nausea And Vomiting        Medication List     STOP taking these medications    acetaminophen 500 MG tablet Commonly known as: TYLENOL       TAKE these medications    acyclovir 400 MG tablet Commonly known as: ZOVIRAX Take 400 mg by mouth 3 (three) times daily as needed (breakout).   albuterol 108 (90 Base) MCG/ACT inhaler Commonly known as: VENTOLIN HFA Inhale 2 puffs into the lungs daily as needed for wheezing or shortness of breath.   Cinnamon 500 MG capsule Take 500 mg by mouth 2 (two) times daily.   citalopram 20 MG tablet Commonly known as: CELEXA Take 20 mg by mouth daily.   CO-Q 10 Omega-3 Fish Oil Caps Take 1 capsule by mouth at bedtime.   estradiol 0.1 MG/GM vaginal cream Commonly known as: ESTRACE Place 1 Applicatorful vaginally every other day. Night   gabapentin 100  MG capsule Commonly known as: NEURONTIN Take 100 mg by mouth 3 (three) times daily.   glipiZIDE 10 MG tablet Commonly known as: GLUCOTROL Take 10 mg by mouth 2 (two) times daily.   Januvia 100 MG tablet Generic drug: sitaGLIPtin Take 100 mg by mouth daily.   lisinopril 2.5 MG tablet Commonly known as: ZESTRIL Take 2.5 mg by mouth daily.   meclizine 25 MG tablet Commonly known as: ANTIVERT Take 25 mg by mouth 3 (three) times daily as needed for dizziness or nausea.    methocarbamol 500 MG tablet Commonly known as: ROBAXIN Take 1 tablet (500 mg total) by mouth every 6 (six) hours as needed for muscle spasms.   multivitamin capsule Take 1 capsule by mouth daily.   oxyCODONE-acetaminophen 5-325 MG tablet Commonly known as: PERCOCET/ROXICET Take 1 tablet by mouth every 4 (four) hours as needed for severe pain.   pioglitazone 30 MG tablet Commonly known as: ACTOS Take 30 mg by mouth daily.   polyethylene glycol powder 17 GM/SCOOP powder Commonly known as: GLYCOLAX/MIRALAX Take 0.5 Containers by mouth daily as needed for mild constipation or moderate constipation.   PRESERVISION AREDS PO Take 1 tablet by mouth daily.   simvastatin 40 MG tablet Commonly known as: ZOCOR Take 40 mg by mouth at bedtime.   VITAMIN D3 PO Take 1 tablet by mouth 2 (two) times a week.        Diagnostic Studies: DG Cervical Spine 2 or 3 views  Result Date: 10/06/2022 CLINICAL DATA:  Elective surgery EXAM: CERVICAL SPINE - 2-3 VIEW COMPARISON:  September 24, 2022 cervical radiographs. FINDINGS: Fluoro time: 13 seconds Radiation: 0.81 mGy Three C-arm fluoroscopic images were obtained intraoperatively and submitted for post operative interpretation. These images demonstrate surgical changes associated with C3-C4 ACDF. Please see the performing provider's procedural report for further detail. IMPRESSION: Intraoperative fluoroscopy from C3-C4 ACDF. Electronically Signed   By: Margaretha Sheffield M.D.   On: 10/06/2022 14:52   DG C-Arm 1-60 Min-No Report  Result Date: 10/06/2022 Fluoroscopy was utilized by the requesting physician.  No radiographic interpretation.   DG C-Arm 1-60 Min-No Report  Result Date: 10/06/2022 Fluoroscopy was utilized by the requesting physician.  No radiographic interpretation.   MR THORACIC SPINE W WO CONTRAST  Result Date: 09/30/2022 CLINICAL DATA:  Chronic neck pain with right arm paresthesias and weakness. Indeterminate T4 lesion on  cervical MRI. No given history of malignancy. EXAM: MRI THORACIC WITHOUT AND WITH CONTRAST TECHNIQUE: Multiplanar and multiecho pulse sequences of the thoracic spine were obtained without and with intravenous contrast. CONTRAST:  9 cc [employer] COMPARISON:  Cervical MRI 09/26/2022 and 11/30/2005. Cervical radiographs 09/27/2021. Chest CTA 11/26/2008. FINDINGS: Alignment:  Physiologic. Vertebrae: Localizing images of the cervical spine demonstrate stable solid interbody fusion from C5 through C7. The lesion of concern in the T4 vertebral body measures 1.1 x 0.7 cm on sagittal image 6/6. This is well-circumscribed and demonstrates a rim of T1 hyperintensity, diffuse hyperintensity on the inversion recovery images and homogeneous enhancement following contrast. There is a smaller lesion anteriorly in the superior endplate of T6 with similar imaging characteristics. These lesions were not included on the remote cervical MRI. No definite corresponding abnormality on previous chest CT. No aggressive osseous lesions identified. No evidence of acute fracture. Cord: The thoracic cord appears normal in signal and caliber.No abnormal intradural enhancement. Paraspinal and other soft tissues: No significant paraspinal abnormalities. Disc levels: Mild multilevel thoracic spondylosis with small disc protrusions. No cord deformity or high-grade spinal stenosis  identified. There is mild right-sided foraminal narrowing at T2-3. Small broad-based central disc protrusions are present at T7-8, T8-9 and T9-10. There is a small left paracentral disc protrusion at T11-12. IMPRESSION: 1. The lesion of concern in the T4 vertebral body has nonspecific imaging characteristics but no aggressive features and may reflect an atypical hemangioma. There is a smaller lesion anteriorly in the superior endplate of T6 which has similar imaging characteristics. Consider follow-up MRI in 6 months to assess stability. 2. No aggressive osseous lesions  identified. 3. Mild thoracic spondylosis with small disc protrusions as described. No cord deformity or high-grade spinal stenosis. Electronically Signed   By: Richardean Sale M.D.   On: 09/30/2022 11:29   MR Cervical Spine w/o contrast  Result Date: 09/26/2022 CLINICAL DATA:  Neck pain, chronic. Right arm paresthesias. Right arm weakness. Numbness in the right thumb. EXAM: MRI CERVICAL SPINE WITHOUT CONTRAST TECHNIQUE: Multiplanar, multisequence MR imaging of the cervical spine was performed. No intravenous contrast was administered. COMPARISON:  Cervical spine radiographs 09/24/2022. MRI of the cervical spine at [facility] neuro surgery 11/30/2005 FINDINGS: Alignment: Slight degenerative anterolisthesis is again noted at C4-5, just above the fusion. Grade 1 anterolisthesis at C7-T1 measures 2 mm. 2 mm anterolisthesis is also present at T1-2. Straightening of the normal cervical lordosis is present. Vertebrae: A T2 hyperintense lesion present at T4. This is not discretely visualized on prior imaging. No other focal lesions are present. Cord: The ventral surface the cord is markedly distorted on the right at C3-4. Abnormal T2 signal is present in the cord over 2 cm C3-4 level. Cord signal and morphology is otherwise normal. Posterior Fossa, vertebral arteries, paraspinal tissues: Negative. Disc levels: C2-3: Asymmetric left-sided uncovertebral spurring and facet spurring leads to progressive moderate left foraminal stenosis. The right foramen is patent. C3-4: A large disc extrusion is asymmetric on the right, extending 14 mm cephalo caudad and resulting in severe right central canal stenosis. The central canal is narrowed to less than 5 mm in the midline and worse on the right. Uncovertebral and facet spurring results in progressive severe left and mild right foraminal narrowing. C4-5: A progressive rightward disc osteophyte complex effaces the ventral CSF. Slight distortion of the ventral surface the cord is  present without additional T2 signal change. Progressive moderate foraminal narrowing is worse right than left. C5-6: Solid fusion is present anteriorly. No residual or recurrent stenosis is present. C6-7: Solid fusion is present anteriorly. No residual or recurrent stenosis is present. C7-T1: Moderate facet hypertrophy is present bilaterally. Grade 1 anterolisthesis is present. Mild right foraminal narrowing is present. IMPRESSION: 1. Severe right central canal stenosis at C3-4 secondary to large disc extrusion. Abnormal cord signal likely represents edema. The canal is narrowed to less than 5 mm in the midline and worse on the right. 2. Progressive severe left and mild right foraminal stenosis at C3-4. 3. Progressive moderate foraminal narrowing bilaterally at C4-5 is worse on the right. 4. Solid fusion anteriorly at C5-6 and C6-7 without residual or recurrent stenosis. 5. Moderate facet hypertrophy and grade 1 anterolisthesis at C7-T1 with mild right foraminal narrowing. 6. T2 hyperintense lesion at T4 is not discretely visualized on prior imaging. This is nonspecific, malignancy is not excluded. Recommend MRI of the thoracic spine without and with contrast for further evaluation. These results will be called to the ordering clinician or representative by the Radiologist Assistant, and communication documented in the PACS or [employer]. Electronically Signed   By: Wynetta Fines.D.  On: 09/26/2022 07:14   XR Cervical Spine 2 or 3 views  Result Date: 09/24/2022 Radiographs of her cervical spine including extension and flexion views were reviewed today.  She is status post cervical fusion.  She does have some listhesis at the superior to the fusion but appears stable with flexion-extension views.  Significant degenerative changes.  XR Shoulder Right  Result Date: 09/24/2022 Radiographs of her right shoulder were reviewed today.  Humeral head is well reduced in the glenoid fossa.  Overall  well-maintained joint spacing no significant arthrosis of the acromial clavicular joint.   Discharge Instructions     Incentive spirometry RT   Complete by: As directed         Follow-up Information     Marybelle Killings, MD. Schedule an appointment as soon as possible for a visit in 1 week(s).   Specialty: Orthopedic Surgery Why: need return office visit with dr yates one week postop Contact information: [place] [address] [phone]                 Discharge Plan:  discharge to home  Disposition:     Signed: Benjiman Core  10/11/2022, 11:25 AM

## 2022-10-15 ENCOUNTER — Ambulatory Visit (INDEPENDENT_AMBULATORY_CARE_PROVIDER_SITE_OTHER): Payer: PPO

## 2022-10-15 ENCOUNTER — Ambulatory Visit (INDEPENDENT_AMBULATORY_CARE_PROVIDER_SITE_OTHER): Payer: PPO | Admitting: Orthopaedic Surgery

## 2022-10-15 ENCOUNTER — Encounter: Payer: Self-pay | Admitting: Orthopaedic Surgery

## 2022-10-15 VITALS — BP 127/68 | HR 68 | Ht 64.0 in | Wt 197.0 lb

## 2022-10-15 DIAGNOSIS — Z981 Arthrodesis status: Secondary | ICD-10-CM

## 2022-10-15 MED ORDER — METHOCARBAMOL 500 MG PO TABS: 500.0000 mg | ORAL_TABLET | Freq: Four times a day (QID) | ORAL | 0 refills | Status: AC | PRN

## 2022-10-15 MED ORDER — OXYCODONE-ACETAMINOPHEN 5-325 MG PO TABS: 1.0000 | ORAL_TABLET | ORAL | 0 refills | Status: DC | PRN
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
## 2022-10-18 ENCOUNTER — Other Ambulatory Visit: Payer: Self-pay | Admitting: Orthopaedic Surgery

## 2022-10-18 ENCOUNTER — Telehealth: Payer: Self-pay | Admitting: Radiology

## 2022-10-18 MED ORDER — OXYCODONE-ACETAMINOPHEN 5-325 MG PO TABS: 1.0000 | ORAL_TABLET | ORAL | 0 refills | Status: AC | PRN

## 2022-10-18 NOTE — Telephone Encounter (Signed)
I called patient and advised. 

## 2022-10-18 NOTE — Telephone Encounter (Signed)
Patient left voicemail asking that the Oxycodone you prescribed on Friday be sent to [facility] on [address]. They do not have the medication at [facility].  Please advise.

## 2022-11-19 ENCOUNTER — Encounter: Payer: PPO | Admitting: Orthopaedic Surgery

## 2022-12-01 ENCOUNTER — Ambulatory Visit (INDEPENDENT_AMBULATORY_CARE_PROVIDER_SITE_OTHER): Payer: PPO | Admitting: Orthopaedic Surgery

## 2022-12-01 ENCOUNTER — Encounter: Payer: Self-pay | Admitting: Orthopaedic Surgery

## 2022-12-01 ENCOUNTER — Ambulatory Visit: Payer: Self-pay

## 2022-12-01 VITALS — BP 127/74 | HR 98 | Ht 64.0 in | Wt 197.0 lb

## 2022-12-01 DIAGNOSIS — Z981 Arthrodesis status: Secondary | ICD-10-CM
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
## 2023-03-01 ENCOUNTER — Encounter: Payer: Self-pay | Admitting: Orthopaedic Surgery

## 2023-03-01 ENCOUNTER — Ambulatory Visit (INDEPENDENT_AMBULATORY_CARE_PROVIDER_SITE_OTHER): Payer: PPO | Admitting: Orthopaedic Surgery

## 2023-03-01 VITALS — BP 110/63 | HR 80 | Ht 64.0 in | Wt 205.0 lb

## 2023-03-01 DIAGNOSIS — Z981 Arthrodesis status: Secondary | ICD-10-CM
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
## 2024-01-17 ENCOUNTER — Ambulatory Visit: Payer: PPO | Admitting: Orthopaedic Surgery

## 2024-01-17 ENCOUNTER — Other Ambulatory Visit: Payer: Self-pay

## 2024-01-17 VITALS — Ht 64.0 in | Wt 205.0 lb

## 2024-01-17 DIAGNOSIS — Z981 Arthrodesis status: Secondary | ICD-10-CM | POA: Diagnosis not present
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
## 2024-09-17 ENCOUNTER — Encounter: Payer: Self-pay | Admitting: Radiology
